# Patient Record
Sex: Male | Born: 1985 | Race: White | Hispanic: No | Marital: Single | Smoking: Former smoker
Health system: Southern US, Community
[De-identification: ages and names within clinical notes are randomized; demographics above are authoritative.]

## PROBLEM LIST (undated history)

## (undated) DIAGNOSIS — F909 Attention-deficit hyperactivity disorder, unspecified type: Secondary | ICD-10-CM

## (undated) DIAGNOSIS — G118 Other hereditary ataxias: Secondary | ICD-10-CM

## (undated) DIAGNOSIS — R269 Unspecified abnormalities of gait and mobility: Secondary | ICD-10-CM

## (undated) DIAGNOSIS — I10 Essential (primary) hypertension: Secondary | ICD-10-CM

## (undated) HISTORY — DX: Attention-deficit hyperactivity disorder, unspecified type: F90.9

## (undated) HISTORY — PX: WISDOM TOOTH EXTRACTION: SHX21

## (undated) HISTORY — DX: Other hereditary ataxias: G11.8

## (undated) HISTORY — PX: THYROID SURGERY: SHX805

---
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
## 2006-05-18 ENCOUNTER — Emergency Department (HOSPITAL_COMMUNITY): Admission: EM | Admit: 2006-05-18 | Discharge: 2006-05-18 | Payer: Self-pay | Admitting: Emergency Medicine

## 2007-10-08 ENCOUNTER — Ambulatory Visit: Payer: Self-pay | Admitting: Pediatrics

## 2007-10-20 ENCOUNTER — Ambulatory Visit: Payer: Self-pay | Admitting: Pediatrics

## 2008-12-30 ENCOUNTER — Encounter: Admission: RE | Admit: 2008-12-30 | Discharge: 2008-12-30 | Payer: Self-pay | Admitting: Family Medicine

## 2010-04-02 IMAGING — CT CT HEAD W/O CM
2 series · 16 of 30 positions shown, 20 images · non-contrast
Comparison: None

CLINICAL DATA: Headache.

CT HEAD WITHOUT CONTRAST
TECHNIQUE: Contiguous axial images were obtained from the base of
the skull through the vertex without contrast

[Series 2: head wo · axial · 0.49mm/px · z∈[-371,-225]mm · 13 of 34 slices shown, 17 images]
[im 3/34  brain]
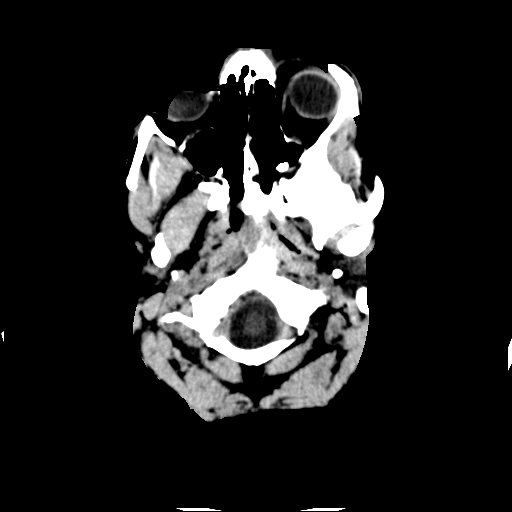
[im 3/34  bone]
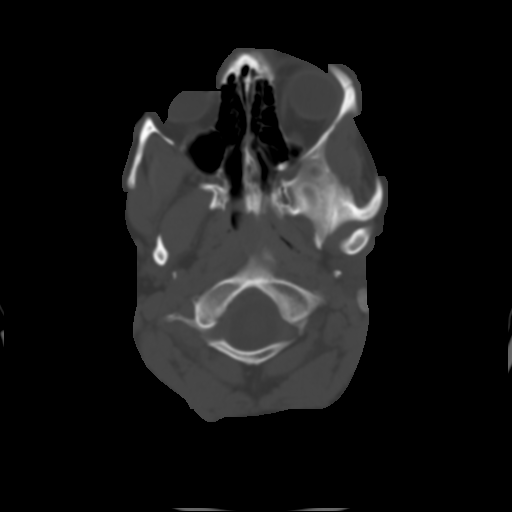
[im 5/34  brain]
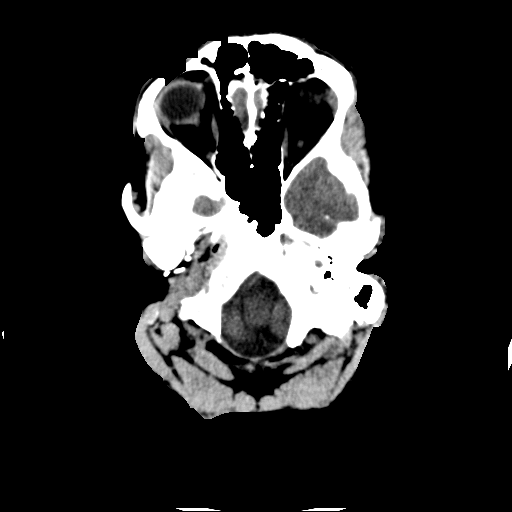
[im 8/34  brain]
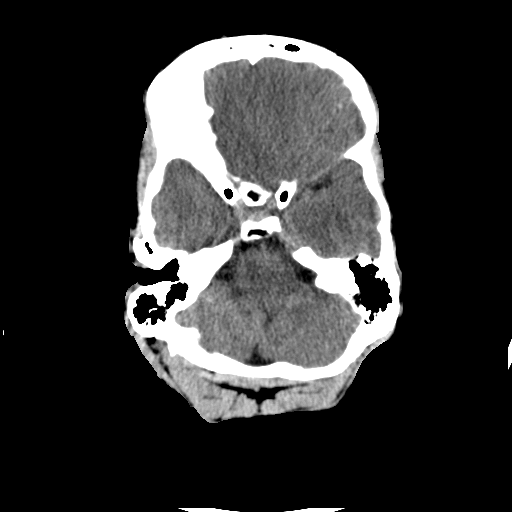
[im 10/34  brain]
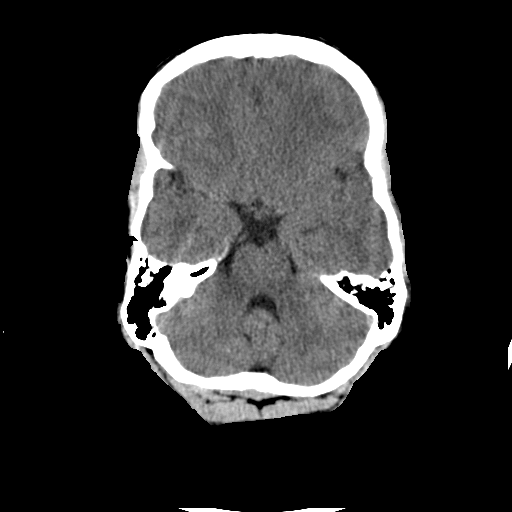
[im 12/34  brain]
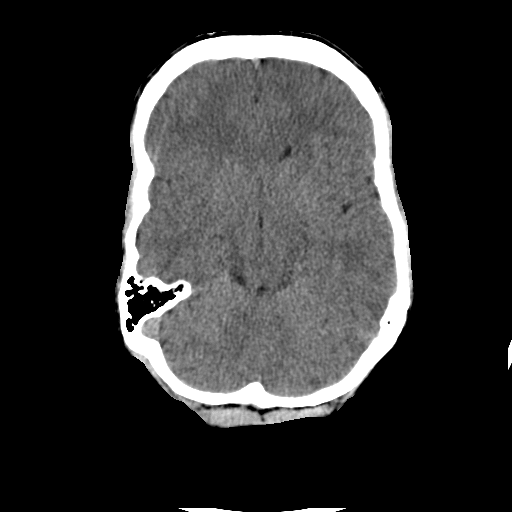
[im 12/34  bone]
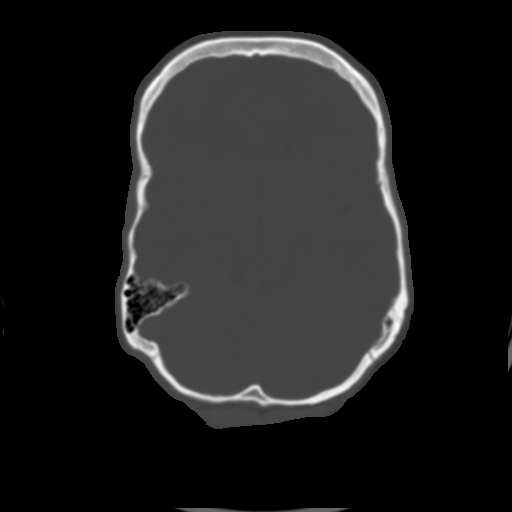
[im 15/34  brain]
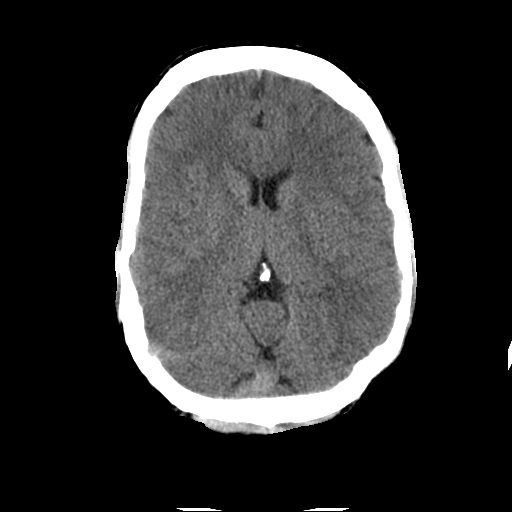
[im 17/34  brain]
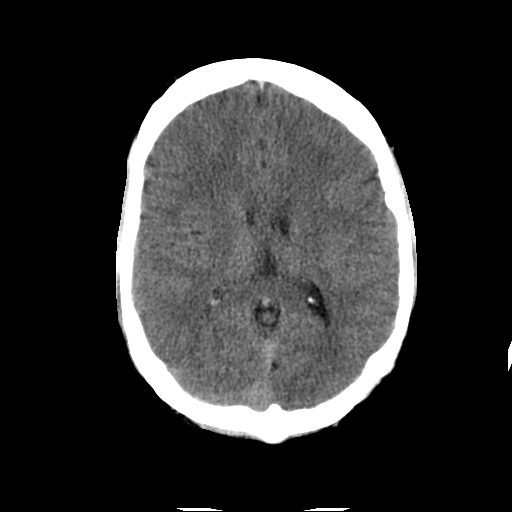
[im 19/34  brain]
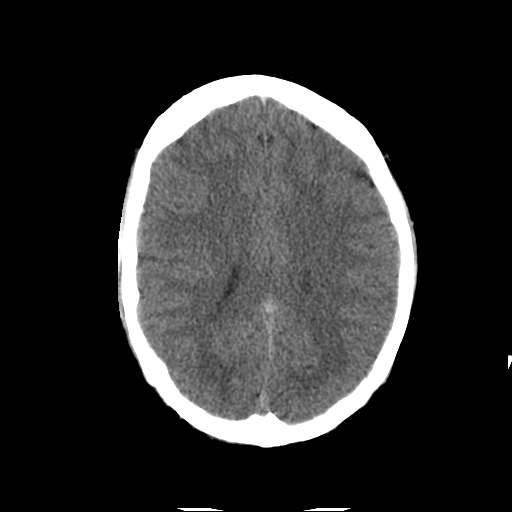
[im 22/34  brain]
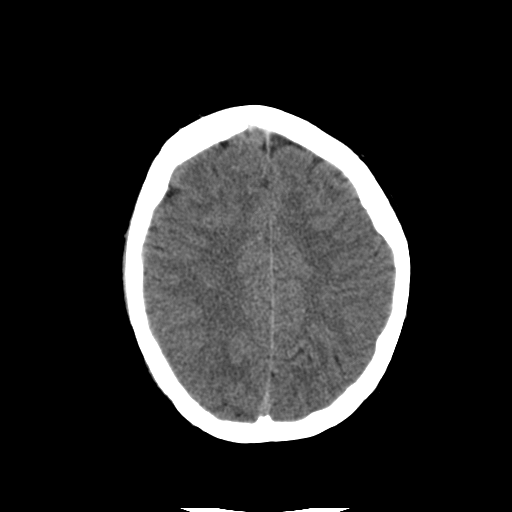
[im 22/34  bone]
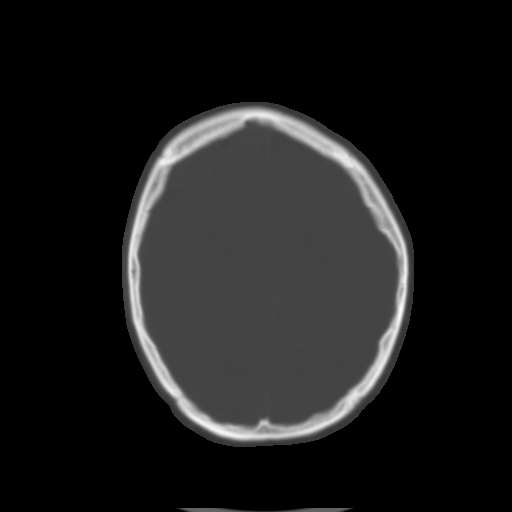
[im 24/34  brain]
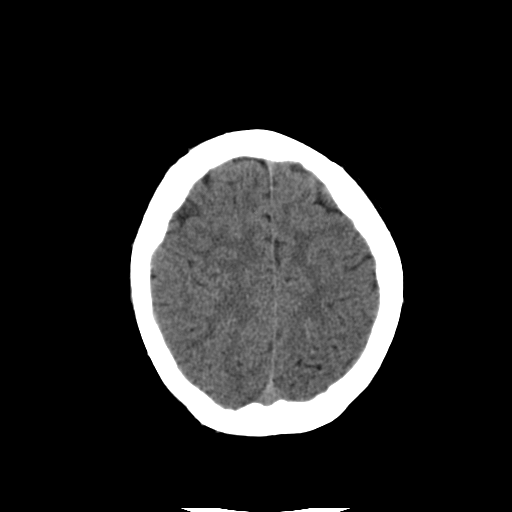
[im 26/34  brain]
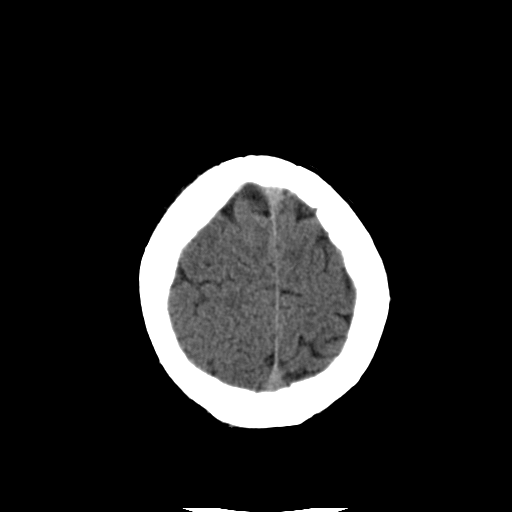
[im 29/34  brain]
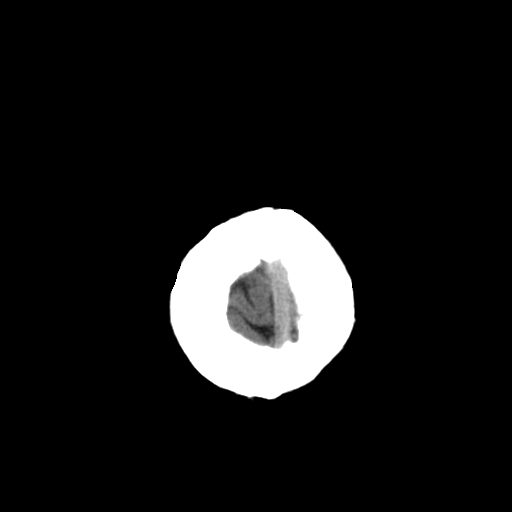
[im 31/34  brain]
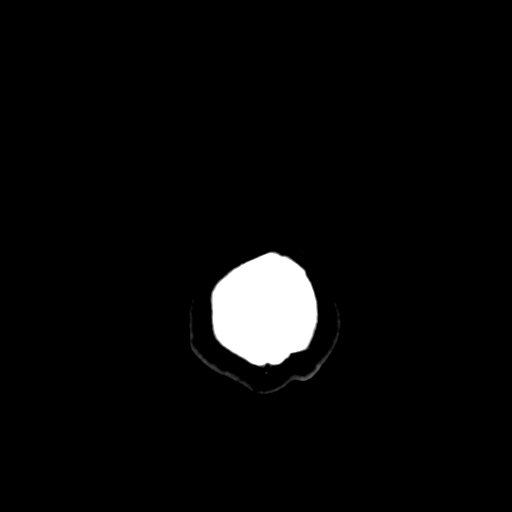
[im 31/34  bone]
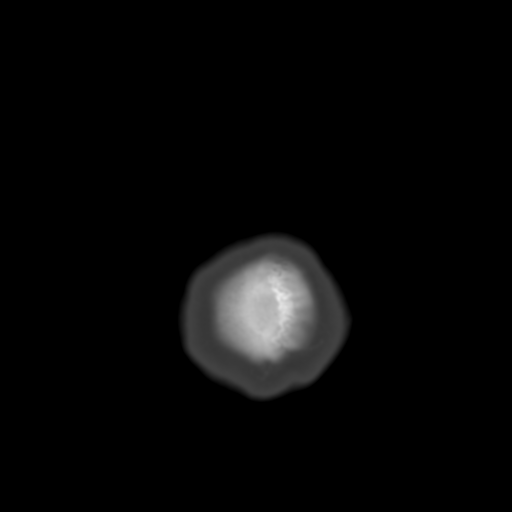

[Series 3: head bone · axial · 0.49mm/px · z∈[-371,-324]mm · 3 of 34 slices shown]
[im 3/34  bone]
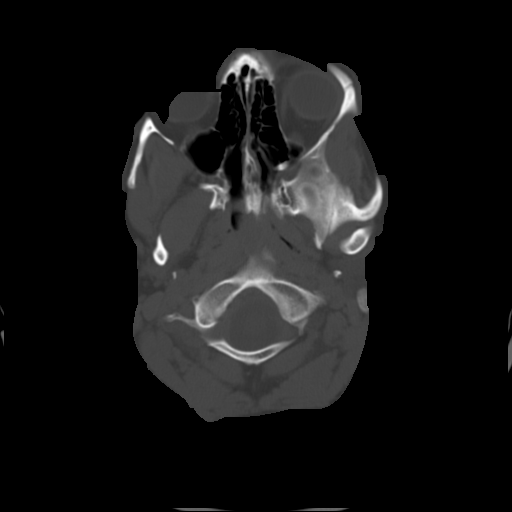
[im 8/34  bone]
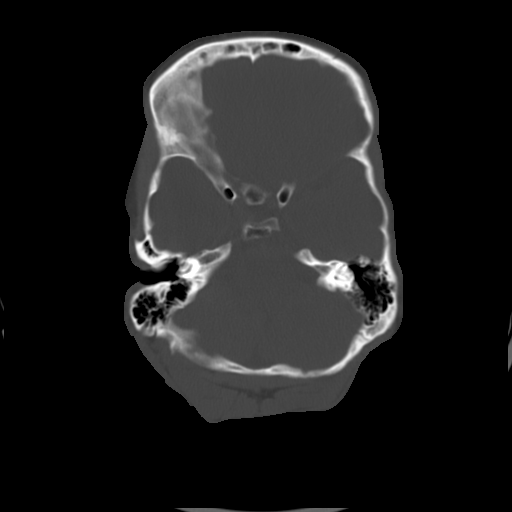
[im 12/34  bone]
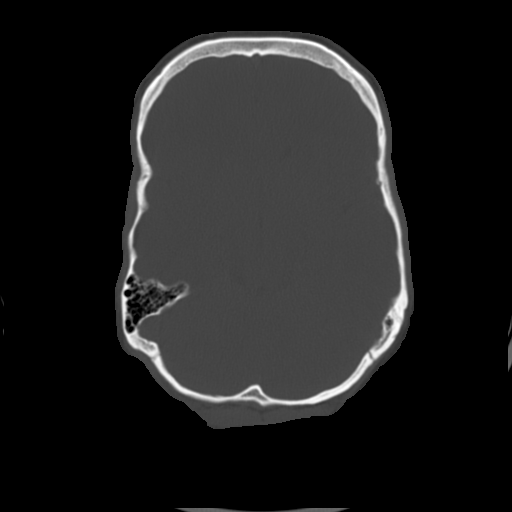

[16 of 30 positions shown; findings below may reference images not displayed]

FINDINGS: The brain has a normal appearance without evidence for
hemorrhage, acute infarction, hydrocephalus, or mass lesion.  There
is no extra axial fluid collection.  The skull and paranasal
sinuses are normal.
IMPRESSION: Normal CT of the head without contrast.

## 2011-06-04 ENCOUNTER — Other Ambulatory Visit: Payer: Self-pay | Admitting: Orthopedic Surgery

## 2011-06-04 DIAGNOSIS — M25562 Pain in left knee: Secondary | ICD-10-CM

## 2011-06-06 ENCOUNTER — Ambulatory Visit
Admission: RE | Admit: 2011-06-06 | Discharge: 2011-06-06 | Disposition: A | Discharge status: Home / Self Care | Payer: BC Managed Care – PPO | Source: Ambulatory Visit | Attending: Orthopedic Surgery | Admitting: Orthopedic Surgery

## 2011-06-06 DIAGNOSIS — M25562 Pain in left knee: Secondary | ICD-10-CM

## 2014-04-04 ENCOUNTER — Emergency Department (HOSPITAL_COMMUNITY)
Admission: EM | Admit: 2014-04-04 | Discharge: 2014-04-04 | Disposition: A | Discharge status: Home / Self Care | Payer: BLUE CROSS/BLUE SHIELD | Attending: Emergency Medicine | Admitting: Emergency Medicine

## 2014-04-04 ENCOUNTER — Encounter (HOSPITAL_COMMUNITY): Payer: Self-pay | Admitting: Emergency Medicine

## 2014-04-04 ENCOUNTER — Emergency Department (HOSPITAL_COMMUNITY): Payer: BLUE CROSS/BLUE SHIELD

## 2014-04-04 DIAGNOSIS — Z720 Tobacco use: Secondary | ICD-10-CM | POA: Diagnosis not present

## 2014-04-04 DIAGNOSIS — I10 Essential (primary) hypertension: Secondary | ICD-10-CM | POA: Diagnosis not present

## 2014-04-04 DIAGNOSIS — Z880 Allergy status to penicillin: Secondary | ICD-10-CM | POA: Insufficient documentation

## 2014-04-04 DIAGNOSIS — R079 Chest pain, unspecified: Secondary | ICD-10-CM | POA: Diagnosis present

## 2014-04-04 DIAGNOSIS — R0789 Other chest pain: Secondary | ICD-10-CM | POA: Diagnosis not present

## 2014-04-04 HISTORY — DX: Essential (primary) hypertension: I10

## 2014-04-04 MED ORDER — OMEPRAZOLE 20 MG PO CPDR: 20.0000 mg | DELAYED_RELEASE_CAPSULE | Freq: Every day | ORAL | Status: DC

## 2014-04-04 NOTE — ED Notes (Signed)
Pt reports sharp chest pain at 0400, fell back asleep, and awoke to pain again. Pt denies pain at present time. Pt reports had three beers prior to bed.

## 2014-04-04 NOTE — ED Provider Notes (Signed)
CSN: [id number]     Arrival date & time 04/04/14  0636 History   First MD Initiated Contact with Patient 04/04/14 [phone]     Chief Complaint  Patient presents with  . Chest Pain      The history is provided by the patient.   patient presents to the emergency department describing a sharp central chest pain without radiation.  It woke him up at around 4:30 AM.  Currently he is without any discomfort or pain.  He smokes cigarettes and reports that one point he was told he had high blood pressure.  No family history of early cardiac disease.  Denies diabetes or high cholesterol.  He states that he did drink alcohol yesterday which is atypical for him.  He denies upper abdominal pain.  Denies nausea or vomiting.  No shortness of breath.  No recent upper respiratory tract infections.  Denies cough or congestion.  He states he exerts himself regularly at work and never develops chest pain or pressure or shortness of breath while at work.  Symptoms were mild to moderate in severity.  Currently is without any discomfort or pain.  Past Medical History  Diagnosis Date  . Hypertension    No past surgical history on file. No family history on file. History  Substance Use Topics  . Smoking status: Current Every Day Smoker -- 0.50 packs/day    Types: Cigarettes  . Smokeless tobacco: Never Used  . Alcohol Use: Yes    Review of Systems  All other systems reviewed and are negative.     Allergies  Amoxicillin  Home Medications   Prior to Admission medications   Not on File   BP 162/92 mmHg  Pulse 92  Resp 16  Ht  (1.88 m)  Wt 185 lb 12.8 oz (84.278 kg)  BMI 23.85 kg/m2  SpO2 100% Physical Exam  Constitutional: He is oriented to person, place, and time. He appears well-developed and well-nourished.  HENT:  Head: Normocephalic and atraumatic.  Eyes: EOM are normal.  Neck: Normal range of motion.  Cardiovascular: Normal rate, regular rhythm, normal heart sounds and intact distal  pulses.   Pulmonary/Chest: Effort normal and breath sounds normal. No respiratory distress.  Abdominal: Soft. He exhibits no distension. There is no tenderness.  Musculoskeletal: Normal range of motion.  Neurological: He is alert and oriented to person, place, and time.  Skin: Skin is warm and dry.  Psychiatric: He has a normal mood and affect. Judgment normal.  Nursing note and vitals reviewed.   ED Course  Procedures (including critical care time) Labs Review Labs Reviewed - No data to display  Imaging Review Dg Chest 2 View  04/04/2014   CLINICAL DATA:  Mid chest pain beginning this morning.  EXAM: CHEST  2 VIEW  COMPARISON:  None.  FINDINGS: The lungs are clear. Heart size is normal. There is no pneumothorax or pleural effusion. Thoracolumbar scoliosis is noted.  IMPRESSION: No acute disease.   Electronically Signed   By: Drusilla Kanner M.D.   On: 04/04/2014 08:05   I personally reviewed the imaging tests through PACS system I reviewed available ER/hospitalization records through the EMR if available    EKG Interpretation   Date/Time:  Monday April 04 2014 06:56:55 EST Ventricular Rate:  82 PR Interval:  158 QRS Duration: 87 QT Interval:  362 QTC Calculation: 423 R Axis:   62 Text Interpretation:  Sinus rhythm Left atrial enlargement ST elev,  probable normal early repol pattern Baseline  wander in lead(s) V2 No old  tracing to compare Confirmed by Hydee Fleece  MD, Caryn Bee ([id number]) on 04/04/2014  7:03:36 AM      MDM   Final diagnoses:  Chest pain    Atypical chest pain.  Doubt ACS.  Doubt PE.  PERC negative.  Suspect GI related issues/esophageal spasm.  Home on Prilosec.  Asymptomatic at this time.  EKG and chest x-ray are normal    Lyanne Co, MD 04/04/14 [phone]

## 2014-04-04 NOTE — Discharge Instructions (Signed)

## 2014-04-04 NOTE — ED Notes (Signed)
Pt arrived to the ED with a complaint of chest pain.  Pt states pain woke him up at 0430 this am with a sharp sensation.  Pt presently doesn't have the pain.  Pt states he feels his left arm going numb.

## 2015-07-06 IMAGING — CR DG CHEST 2V
2 series · 2 of 2 positions shown · non-contrast
Comparison: None.

CLINICAL DATA: Mid chest pain beginning this morning.

EXAM:
CHEST  2 VIEW

[w chest pa]
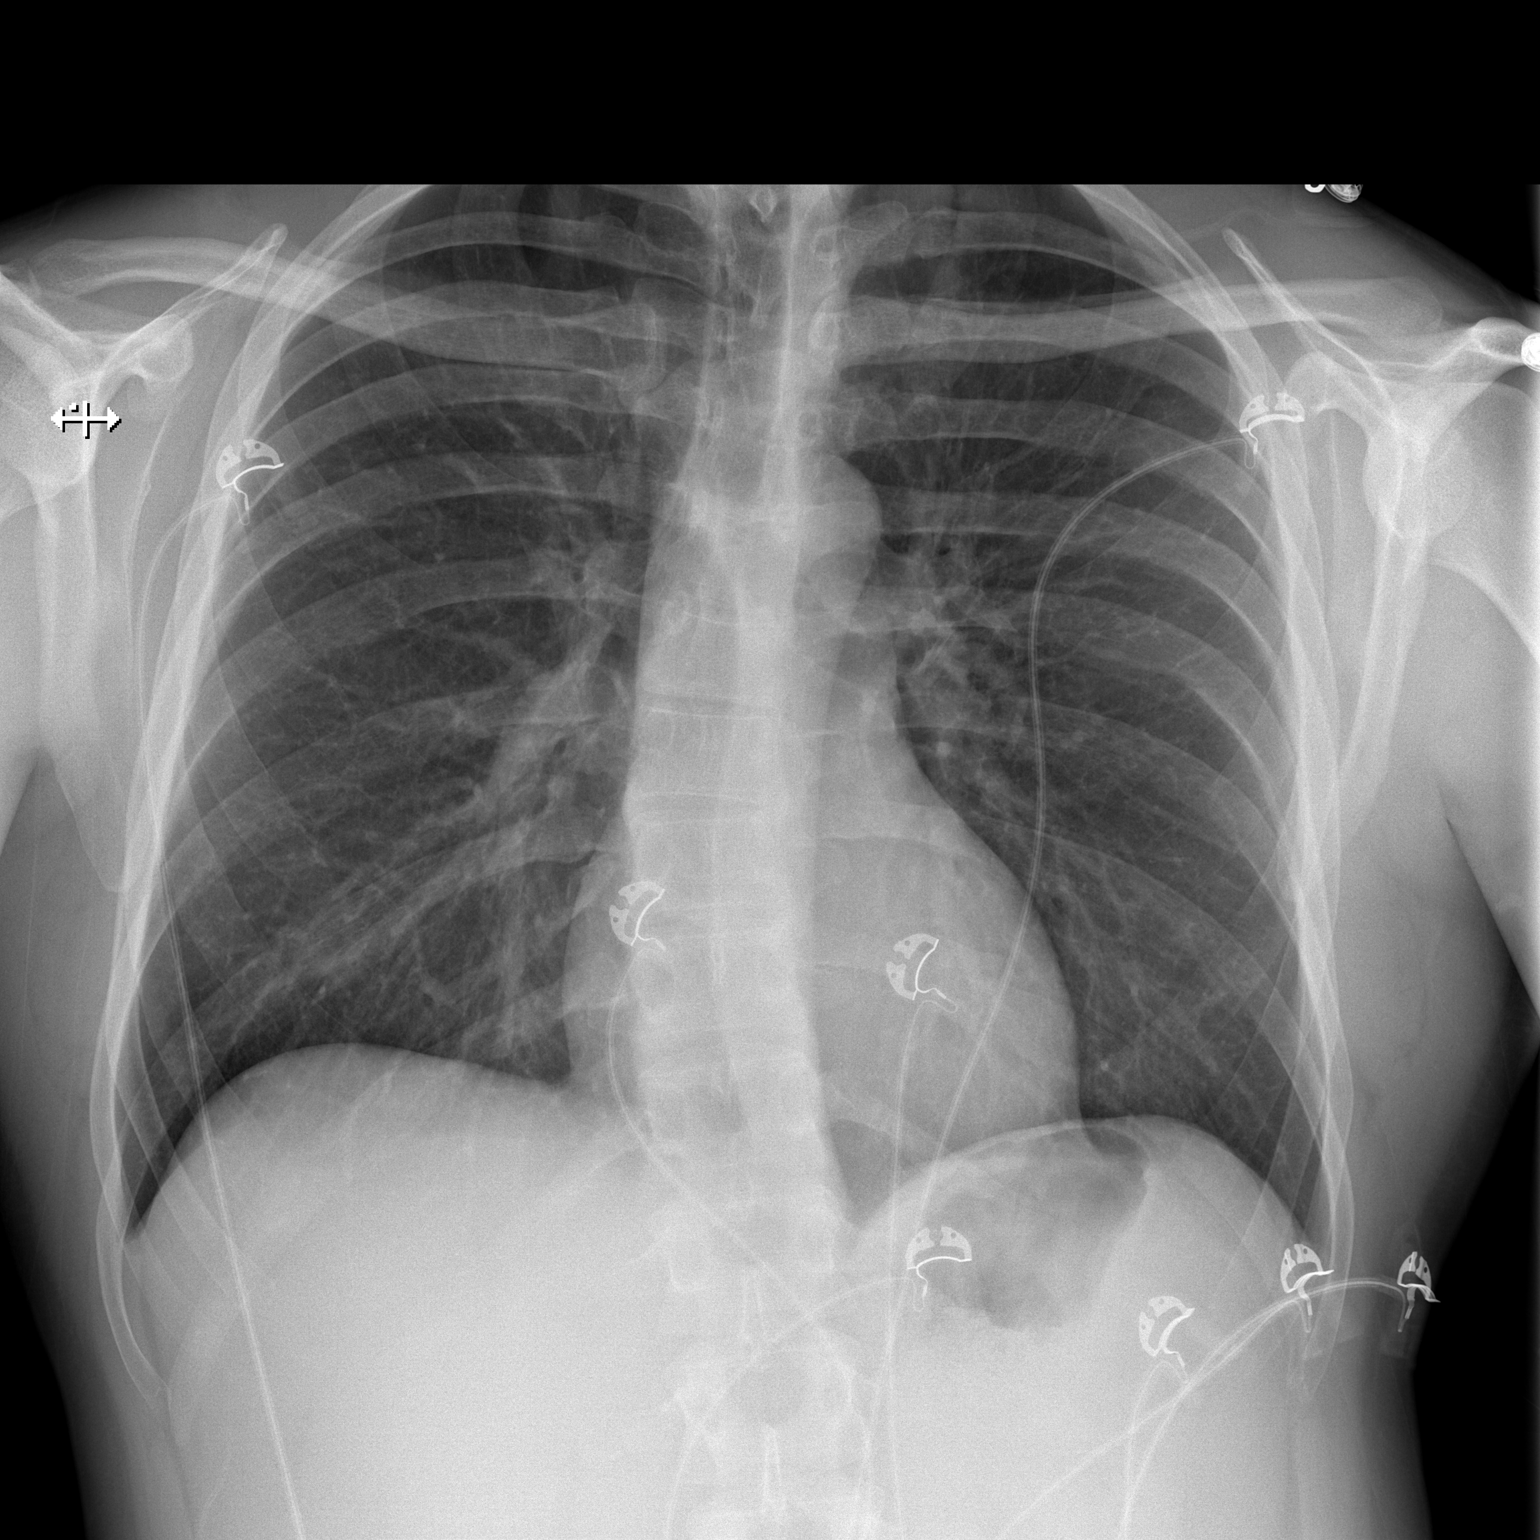

[w chest lat]
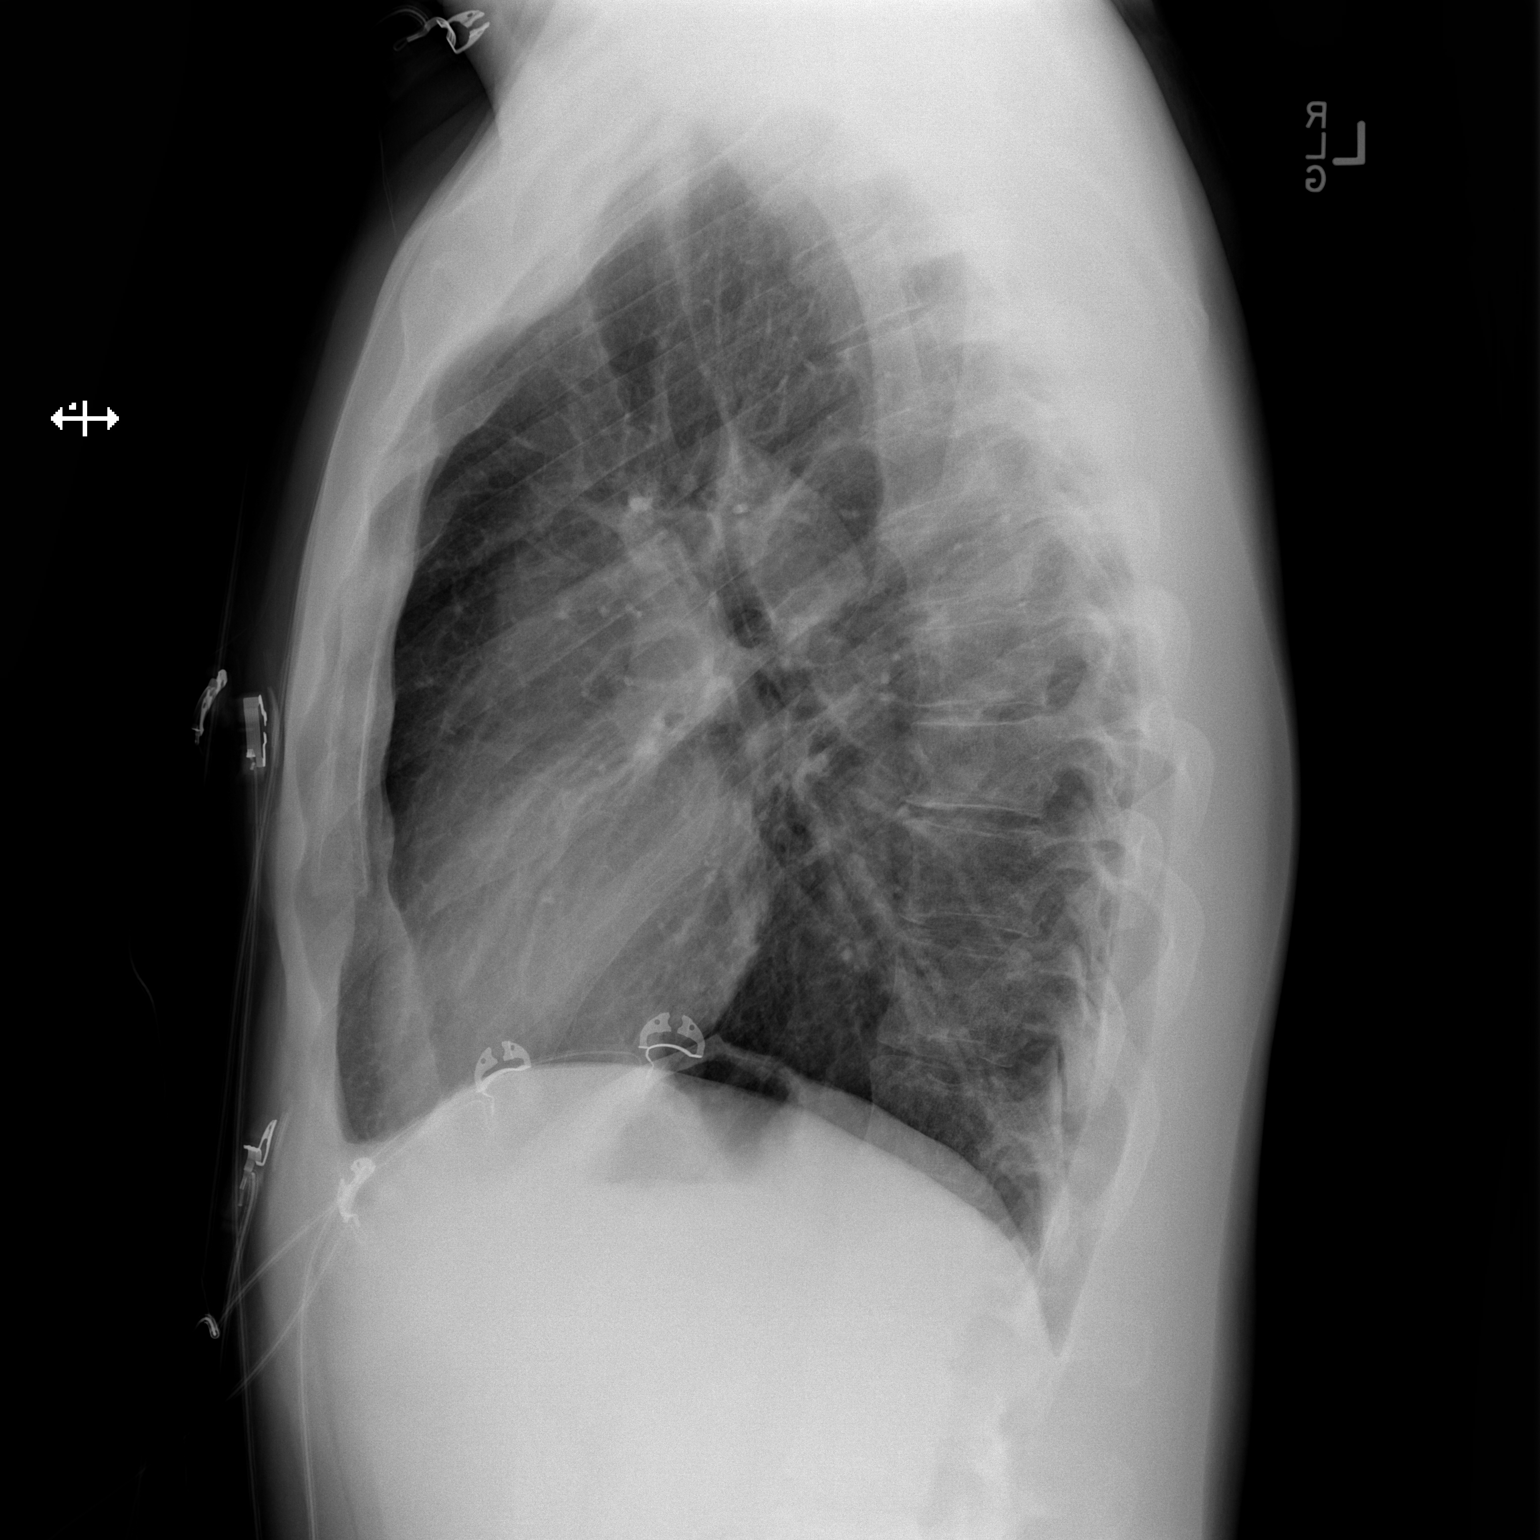

[2 of 2 positions shown; findings below may reference images not displayed]

FINDINGS: The lungs are clear. Heart size is normal. There is no pneumothorax
or pleural effusion. Thoracolumbar scoliosis is noted.
IMPRESSION: No acute disease.

## 2017-07-30 ENCOUNTER — Encounter: Payer: Self-pay | Admitting: *Deleted

## 2017-07-31 ENCOUNTER — Ambulatory Visit: Payer: Self-pay | Admitting: Neurology

## 2017-07-31 ENCOUNTER — Encounter: Payer: Self-pay | Admitting: Neurology

## 2017-07-31 ENCOUNTER — Other Ambulatory Visit: Payer: Self-pay

## 2017-07-31 DIAGNOSIS — G118 Other hereditary ataxias: Secondary | ICD-10-CM | POA: Insufficient documentation

## 2017-07-31 HISTORY — DX: Other hereditary ataxias: G11.8

## 2017-07-31 NOTE — Progress Notes (Signed)
Reason for visit: Spinocerebellar ataxia  Referring physician: Dr. Georgiann Hahn is a 32 y.o. male  History of present illness:  Jonathan Hopkins is a 32 year old right-handed white male with a history of difficulty with gait instability that began when he was 25 or 32 years old, this has been gradually worsening over time.  The patient has also noted over the last 1 or 2 years that he has had some horizontal double vision that will come and go.  The patient indicates that if he misses a dose of Adderall that the double vision becomes much worse.  He reports no problems with speech or swallowing, but clinical examination does indicate that he has some mild dysarthria.  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  The patient denies numbness or weakness of the extremities.  He denies issues controlling the bowels or the bladder.  He has a very prominent family history of spinocerebellar ataxia type III.  The patient's mother, maternal aunt, and 3 sisters are affected.  At least 2 of the sisters have had genetic testing, both positive for spinocerebellar ataxia type III.  2 of the 3 sisters are reporting double vision as well.  The patient's mother just passed away with end-stage spinocerebellar ataxia at age 2.  The patient comes to this office for an evaluation.  Past Medical History:  Diagnosis Date  . ADHD   . Hypertension   . 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    Past Surgical History:  Procedure Laterality Date  . WISDOM TOOTH EXTRACTION     x4    Family History  Problem Relation Age of Onset  . Other Mother        Cerebellar ataxia  . Breast cancer Mother   . Ataxia Mother   . Alcoholism Father   . Ataxia Sister   . Ataxia Sister   . Ataxia Sister     Social history:  reports that he has been smoking cigarettes.  He has been smoking  about 0.50 packs per day. He has never used smokeless tobacco. He reports that he drank alcohol. He reports that he does not use drugs.  Medications:  Prior to Admission medications   Medication Sig Start Date End Date Taking? Authorizing Provider  amphetamine-dextroamphetamine (ADDERALL) 15 MG tablet Take 1 tablet by mouth 2 (two) times daily. 03/16/14  Yes [provider]  Ascorbic Acid (VITAMIN C PO) Take 400 mg by mouth daily.   Yes [provider]  Coenzyme Q10 (COQ10 PO) Take 1 capsule by mouth daily.   Yes [provider]  Multiple Vitamins-Minerals (MULTI COMPLETE PO) Take 1 capsule by mouth daily.   Yes [provider]  Omega-3 Fatty Acids (OMEGA 3 PO) Take 1 capsule by mouth daily.   Yes [provider]      Allergies  Allergen Reactions  . Amoxicillin Swelling    ROS:  Out of a complete 14 system review of symptoms, the patient complains only of the following symptoms, and all other reviewed systems are negative.  Double vision  Blood pressure (!) 148/88, pulse 82, height  (1.88 m), weight 197 lb 8 oz (89.6 kg).  Physical Exam  General: The patient is alert and cooperative at the time of the examination.  Eyes: Pupils are equal, round, and reactive to light. Discs are flat bilaterally.  Neck: The neck is supple, no carotid bruits are noted.  Respiratory: The respiratory examination is clear.  Cardiovascular: The cardiovascular examination reveals a regular rate and rhythm, no obvious murmurs or rubs are noted.  Skin: Extremities are without significant edema.  Neurologic Exam  Mental status: The patient is alert and oriented x 3 at the time of the examination. The patient has apparent normal recent and remote memory, with an apparently normal attention span and concentration ability.  Cranial nerves: Facial symmetry is present. There is good sensation of the face to pinprick and soft touch bilaterally. The  strength of the facial muscles and the muscles to head turning and shoulder shrug are normal bilaterally. Speech is slightly dysarthric, not a phasic. Extraocular movements are full.  There may be some mild end-gaze nystagmus.  Visual fields are full. The tongue is midline, and the patient has symmetric elevation of the soft palate. No obvious hearing deficits are noted.  Motor: The motor testing reveals 5 over 5 strength of all 4 extremities. Good symmetric motor tone is noted throughout.  Sensory: Sensory testing is intact to pinprick, soft touch, vibration sensation, and position sense on all 4 extremities. No evidence of extinction is noted.  Coordination: Cerebellar testing reveals very minimal dysmetria with finger-nose-finger and heel shin bilaterally.  Gait and station: Gait is wide-based, slightly ataxic.  Tandem gait is unsteady.  Romberg is negative.  Reflexes: Deep tendon reflexes are symmetric, but are brisk bilaterally. Toes are downgoing bilaterally.   Assessment/Plan:  1.  Spinocerebellar ataxia type 3, Machado-Joseph disease  The patient is having gait instability, mild dysarthria, and double vision associated with his spinocerebellar ataxia syndrome.  Individuals with this disease may also developed dystonias and muscle fasciculations, and signs of parkinsonism.  A peripheral neuropathy and autonomic nervous system dysfunction may also occur.  The patient has at least 5 known family members who are also affected, two have been genetically tested.  There is no indication to pursue genetic testing with this individual, there is no treatment for the disorder.  The patient does not wish to see an ophthalmologist for consideration of prisms for double vision.  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  He will follow-up in 1 year.  He has no medical  insurance.  Jonathan Palau MD 07/31/2017 10:48 AM  [facility] [address] [place], [place] [address]  Phone [phone] Fax [phone]

## 2018-07-22 DIAGNOSIS — Z0289 Encounter for other administrative examinations: Secondary | ICD-10-CM

## 2018-08-04 ENCOUNTER — Telehealth: Payer: Self-pay

## 2018-08-04 NOTE — Telephone Encounter (Signed)
I contacted the pt in regards to his 08/05/2018 yearly f/u appt.  Pt was advised due to current COVID 19 pandemic, our office is severely reducing in office visits until further notice, in order to minimize the risk to our patients and healthcare providers.   Pt was offered a video visit with Dr. Anne Hahn on the same date but declined.  Pt has been rescheduled for 10/19/18 at 12 pm.

## 2018-08-05 ENCOUNTER — Ambulatory Visit: Payer: Self-pay | Admitting: Neurology

## 2018-09-08 DIAGNOSIS — Z0271 Encounter for disability determination: Secondary | ICD-10-CM

## 2018-10-08 DIAGNOSIS — Z0289 Encounter for other administrative examinations: Secondary | ICD-10-CM

## 2018-10-19 ENCOUNTER — Other Ambulatory Visit: Payer: Self-pay

## 2018-10-19 ENCOUNTER — Ambulatory Visit (INDEPENDENT_AMBULATORY_CARE_PROVIDER_SITE_OTHER): Payer: Self-pay | Admitting: Neurology

## 2018-10-19 ENCOUNTER — Encounter: Payer: Self-pay | Admitting: Neurology

## 2018-10-19 VITALS — BP 144/92 | HR 90 | Temp 98.3°F | Ht 73.5 in | Wt 214.3 lb

## 2018-10-19 DIAGNOSIS — G118 Other hereditary ataxias: Secondary | ICD-10-CM

## 2018-10-19 NOTE — Progress Notes (Signed)
Reason for visit: Spinocerebellar ataxia type III  Jonathan Hopkins is an 33 y.o. male  History of present illness:  Jonathan Hopkins is a 33 year old right-handed white male with a history of spinocerebellar ataxia type III, his mother and 3 of his sisters are affected, the patient indicates that he is no longer working.  He last worked on 14 March 2018.  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  The patient has some problems with gait instability, he has moved to a 1 level living arrangement which has been better for him.  He will fall on occasion, he does not use a cane for ambulation.  He has persistent double vision.  The patient reports some occasional tingling in the hands and feet.  He denies any discomfort in the feet at night.  He recently has developed some pain in the knees bilaterally with weightbearing, with pain underneath the kneecaps on both sides.  He has not seen an orthopedic physician for this.  The patient returns for an evaluation.  The patient lives alone.  He is still operates a Teacher, music.  Past Medical History:  Diagnosis Date  . ADHD   . Hypertension   . SCA-3 (spinocerebellar ataxia type 3) ([facility]) 07/31/2017    Past Surgical History:  Procedure Laterality Date  . WISDOM TOOTH EXTRACTION     x4    Family History  Problem Relation Age of Onset  . Other Mother        Cerebellar ataxia  . Breast cancer Mother   . Ataxia Mother   . Alcoholism Father   . Ataxia Sister   . Ataxia Sister   . Ataxia Sister     Social history:  reports that he has been smoking cigarettes. He has been smoking about 0.50 packs per day. He has never used smokeless tobacco. He reports previous alcohol use. He reports that he does not use drugs.    Allergies  Allergen Reactions  . Amoxicillin Swelling    Medications:  Prior to Admission medications   Medication Sig Start Date End Date Taking? Authorizing Provider  Coenzyme Q10 (COQ10 PO) Take 1  capsule by mouth daily.   Yes [provider]  Multiple Vitamins-Minerals (MULTI COMPLETE PO) Take 1 capsule by mouth daily.   Yes [provider]  Omega-3 Fatty Acids (OMEGA 3 PO) Take 1 capsule by mouth daily.   Yes [provider]    ROS:  Out of a complete 14 system review of symptoms, the patient complains only of the following symptoms, and all other reviewed systems are negative.  Gait instability Knee pain  Blood pressure (!) 144/92, pulse 90, temperature 98.3 F (36.8 C), temperature source Temporal, height 6' 1.5" (1.867 m), weight 214 lb 5 oz (97.2 kg), SpO2 98 %.  Physical Exam  General: The patient is alert and cooperative at the time of the examination.  Skin: No significant peripheral edema is noted.   Neurologic Exam  Mental status: The patient is alert and oriented x 3 at the time of the examination. The patient has apparent normal recent and remote memory, with an apparently normal attention span and concentration ability.   Cranial nerves: Facial symmetry is present. Speech is normal, no aphasia or dysarthria is noted, speech may be slightly dysarthric. Extraocular movements are full. Visual fields are full.  Motor: The patient has good strength in all 4 extremities.  Sensory examination: Soft touch sensation is symmetric on  the face, arms, and legs.  Coordination: The patient has minimal dysmetria with finger-nose-finger, he does have more prominent dysmetria with heel-to-shin bilaterally.  Gait and station: The patient has a wide-based, unsteady gait.  Tandem gait is unsteady.  Romberg is negative.  Reflexes: Deep tendon reflexes are symmetric, but reflexes are brisk throughout.   Assessment/Plan:  1.  Spinocerebellar ataxia, type III  2.  Bilateral knee discomfort  The patient is appropriately applying for Social Security disability.  I have written a letter indicating that the patient has a disorder that is inherited and  it will be progressive.  The patient is still able to operate a motor vehicle.  He will follow-up in 1 year.  If the knee pain continues and the patient desires to have an orthopedic surgery referral, he will contact our office.  Jonathan Palau. Keith Willis MD 10/19/2018 12:18 PM  [facility] [address] [place], [place] [address]  Phone [phone] Fax [phone]

## 2018-12-11 ENCOUNTER — Telehealth: Payer: Self-pay | Admitting: Neurology

## 2018-12-11 NOTE — Telephone Encounter (Signed)
Pt is asking for a call from RN on Monday re: advise on how to apply for disabilty for his neurological disease and how Dr Jannifer Franklin can assist him

## 2018-12-14 NOTE — Telephone Encounter (Signed)
I reached out to the pt. He has been in contact with the SSD and has applied for disability. Pt is at the point now where his appeal is coming up and is requesting an appointment with the MD in about 1.5 months to have an up to date assessment. Pt has been scheduled for 01/19/2019 at 12 pm check in time of 11:30 for this appointment.

## 2019-01-19 ENCOUNTER — Other Ambulatory Visit: Payer: Self-pay

## 2019-01-19 ENCOUNTER — Encounter: Payer: Self-pay | Admitting: Neurology

## 2019-01-19 ENCOUNTER — Ambulatory Visit (INDEPENDENT_AMBULATORY_CARE_PROVIDER_SITE_OTHER): Payer: Self-pay | Admitting: Neurology

## 2019-01-19 VITALS — BP 158/112 | HR 82 | Temp 98.7°F | Ht 73.5 in | Wt 217.0 lb

## 2019-01-19 DIAGNOSIS — R269 Unspecified abnormalities of gait and mobility: Secondary | ICD-10-CM

## 2019-01-19 DIAGNOSIS — G118 Other hereditary ataxias: Secondary | ICD-10-CM

## 2019-01-19 HISTORY — DX: Unspecified abnormalities of gait and mobility: R26.9

## 2019-01-19 NOTE — Progress Notes (Signed)
Reason for visit: Spinocerebellar ataxia  Jonathan Hopkins is an 33 y.o. male  History of present illness:  Jonathan Hopkins is a 33 year old right-handed white male with a history of type III spinocerebellar ataxia.  The patient has had progressive worsening of his ability to ambulate.  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  The patient is having increasing problems with balance, he is falling on occasion, the last fall was 2 weeks ago.  The patient does not use a cane or a walking stick for ambulation.  He has problems with restless leg syndrome and leg cramps, he feels tension throughout the body.  He has double vision that restricts his ability to operate a motor vehicle in the evening hours, he is able to do some driving during the daytime.  The patient has multiple family members who are involved, 3 sisters are similarly affected.  He is applying for Social Security disability currently, he has been denied on one occasion.  Past Medical History:  Diagnosis Date  . ADHD   . Hypertension   . 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    Past Surgical History:  Procedure Laterality Date  . WISDOM TOOTH EXTRACTION     x4    Family History  Problem Relation Age of Onset  . Other Mother        Cerebellar ataxia  . Breast cancer Mother   . Ataxia Mother   . Alcoholism Father   . Ataxia Sister   . Ataxia Sister   . Ataxia Sister     Social history:  reports that he has been smoking cigarettes. He has been smoking about 0.50 packs per day. He has never used smokeless tobacco. He reports previous alcohol use. He reports that he does not use drugs.    Allergies  Allergen Reactions  . Amoxicillin Swelling    Medications:  Prior to Admission medications   Medication Sig Start Date End Date Taking? Authorizing Provider  Multiple Vitamins-Minerals (MULTI  COMPLETE PO) Take 1 capsule by mouth daily.   Yes [provider]  Omega-3 Fatty Acids (OMEGA 3 PO) Take 1 capsule by mouth daily.   Yes [provider]    ROS:  Out of a complete 14 system review of symptoms, the patient complains only of the following symptoms, and all other reviewed systems are negative.  Walking difficulty Double vision  Blood pressure (!) 158/112, pulse 82, temperature 98.7 F (37.1 C), height 6' 1.5" (1.867 m), weight 217 lb (98.4 kg).  Physical Exam  General: The patient is alert and cooperative at the time of the examination.  Skin: No significant peripheral edema is noted.   Neurologic Exam  Mental status: The patient is alert and oriented x 3 at the time of the examination. The patient has apparent normal recent and remote memory, with an apparently normal attention span and concentration ability.   Cranial nerves: Facial symmetry is present. Speech is minimally ataxic, not dysarthric or aphasic. Extraocular movements are full, but with superior gaze there is divergence with exotropia of the right eye. Visual fields are full.  Motor: The patient has good strength in all 4 extremities.  Sensory examination: Soft touch sensation is symmetric on the face, arms, and legs.  Coordination: The patient has mild dysmetria with finger-nose-finger with the upper extremities, more prominent dysmetria with heel-to-shin bilaterally.  Gait and station: The patient has a very wide-based, ataxic gait.  He will stumble on occasion.  He is unable to perform tandem gait.  Romberg is positive, the patient tends to fall to the left side.  Reflexes: Deep tendon reflexes are symmetric, but are brisk throughout.   Assessment/Plan:  1.  Spinocerebellar ataxia, type III  2.  Gait disorder  The patient has a genetic entity that will result in gradually worsening physical deficits with coordination of the arms and legs and with double vision.  He has a  very prominent family history of this similar genetic entity.  He is applying for social for disability.  He has no longer able to perform work-related activities that he has been trained for in the past.  He eventually will lose the ability to ambulate effectively as time goes on.  He currently is disabled from his previous job activities.  He will follow-up for his next scheduled visit.  Jonathan Alexanders MD 01/19/2019 12:44 PM  [facility] [address] [place], [place] [address]  Phone [phone] Fax [phone]

## 2019-04-26 DIAGNOSIS — Z0271 Encounter for disability determination: Secondary | ICD-10-CM

## 2019-10-20 ENCOUNTER — Ambulatory Visit: Payer: Self-pay | Admitting: Neurology

## 2019-10-20 ENCOUNTER — Other Ambulatory Visit: Payer: Self-pay

## 2019-10-20 ENCOUNTER — Encounter: Payer: Self-pay | Admitting: Neurology

## 2019-10-20 VITALS — BP 149/99 | HR 94 | Ht 73.0 in | Wt 207.0 lb

## 2019-10-20 DIAGNOSIS — R269 Unspecified abnormalities of gait and mobility: Secondary | ICD-10-CM

## 2019-10-20 DIAGNOSIS — G118 Other hereditary ataxias: Secondary | ICD-10-CM

## 2019-10-20 NOTE — Progress Notes (Signed)
Reason for visit: Spinocerebellar ataxia  Jonathan Hopkins is an 34 y.o. male  History of present illness:  Jonathan Hopkins is a 34 year old right-handed white male with a history of spinocerebellar ataxia type III.  The patient has had ongoing issues with gait instability, he has had multiple falls.  He reports some urgency with the bowels and bladder.  He has occasional problems with choking on food, he has not had a major change in his ability to speak.  He is unemployed, he is seeking disability.  He now has an attorney for this purpose.  He does operate a motor vehicle but some days he is unable to drive due to worsening problems with double vision.  He is not sleeping well at night.  He has 3 sisters with similar afflictions.  The patient comes to this office for further evaluation.  Past Medical History:  Diagnosis Date  . ADHD   . Gait abnormality 01/19/2019  . Hypertension   . 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    Past Surgical History:  Procedure Laterality Date  . WISDOM TOOTH EXTRACTION     x4    Family History  Problem Relation Age of Onset  . Other Mother        Cerebellar ataxia  . Breast cancer Mother   . Ataxia Mother   . Alcoholism Father   . Ataxia Sister   . Ataxia Sister   . Ataxia Sister     Social history:  reports that he has been smoking cigarettes. He has been smoking about 0.50 packs per day. He has never used smokeless tobacco. He reports previous alcohol use. He reports that he does not use drugs.    Allergies  Allergen Reactions  . Amoxicillin Swelling    Medications:  Prior to Admission medications   Medication Sig Start Date End Date Taking? Authorizing Provider  Multiple Vitamins-Minerals (MULTI COMPLETE PO) Take 1 capsule by mouth daily.   Yes [provider]  Omega-3 Fatty Acids (OMEGA 3 PO) Take 1 capsule by mouth daily.   Yes [provider]    ROS:  Out of a complete 14 system review of  symptoms, the patient complains only of the following symptoms, and all other reviewed systems are negative.  Walking difficulty Occasional choking  Blood pressure (!) 149/99, pulse 94, height 6\' 1"  (1.854 m), weight 207 lb (93.9 kg).  Physical Exam  General: The patient is alert and cooperative at the time of the examination.  Skin: No significant peripheral edema is noted.   Neurologic Exam  Mental status: The patient is alert and oriented x 3 at the time of the examination. The patient has apparent normal recent and remote memory, with an apparently normal attention span and concentration ability.   Cranial nerves: Facial symmetry is present. Speech is normal, no aphasia or dysarthria is noted. Extraocular movements are full. Visual fields are full.  Motor: The patient has good strength in all 4 extremities.  Sensory examination: Soft touch sensation is symmetric on the face, arms, and legs.  Coordination: The patient has mild dysmetria with finger-nose-finger and heel-to-shin bilaterally.  Gait and station: The patient has a wide-based gait, the patient has some instability with turns.  Tandem gait was not attempted.  Romberg is negative but is unsteady.  Reflexes: Deep tendon reflexes are symmetric, reflexes are brisk throughout with exception that the ankle jerk reflexes are somewhat depressed.   Assessment/Plan:  1.  Spinocerebellar ataxia type  III  2.  Gait disturbance  The patient has a genetic progressive neurologic disorder.  He has significant issues with gait instability, he is falling on occasion.  I have indicated that he should be using a cane or a walking stick when he is outside of the house.  The patient is living with his sister currently.  He will follow-up through this office in 1 year.  Marlan Palau MD 10/20/2019 12:26 PM  [facility] [address] [place], [place] [address]  Phone [phone] Fax  [phone]

## 2020-01-13 ENCOUNTER — Telehealth: Payer: Self-pay | Admitting: Neurology

## 2020-01-13 NOTE — Telephone Encounter (Signed)
Left message for patient to call back regarding changes with his disease.

## 2020-01-13 NOTE — Telephone Encounter (Signed)
Pt called stating that he is needing to speak to the RN to update her and the provider about a couple of changes he has had with his disease that he has. Pt did not give any other information. The call was dropped. Please advise.

## 2020-01-17 NOTE — Telephone Encounter (Signed)
Pt called and LVM wanting the RN to call back when she is available.

## 2020-01-19 NOTE — Telephone Encounter (Signed)
Left message for patient to call back on voice mail.

## 2020-01-20 ENCOUNTER — Telehealth: Payer: Self-pay | Admitting: Emergency Medicine

## 2020-01-20 NOTE — Telephone Encounter (Signed)
Called patient back, given a list of complaints/increase of new symptoms.  Forwarded to Dr. Anne Hahn.

## 2020-01-20 NOTE — Telephone Encounter (Signed)
Called patient and scheduled him with Shawnie Dapper, NP for 02/02/20 at 3pm.  Informed to arrive no later than 245.  Patient accepted appointment and agreed to arrival time.  Patient expressed appreciation and stated he will keep his list and write anything else down that he feels needs discussed/addressed with NP at that time.

## 2020-01-20 NOTE — Telephone Encounter (Signed)
Pt has called Erika,RN back. Please call when available to do so.

## 2020-01-20 NOTE — Telephone Encounter (Signed)
   Called patient back, here is his list of new problems he is having with his disease  -Deteriorating hand coordination, ie, open chips, buttoning, writing -Only getting 1-1/2 hours sleep at a time -Numbness, twitching in both hands and feet -Knees randomly give out, having to wearing knee braces -numerous falls, no injuries -hands and legs are cramping up, waking him up -Increased uncontrolled bladder and bowels, 2x3x week urinating on self, bowel monthly -coughing spells daily -difficulty swallowing food and drinks, getting choked up  Sent list to Dr. Anne Hahn with request if okay to see NP or if he needs to see patient.

## 2020-02-02 ENCOUNTER — Encounter: Payer: Self-pay | Admitting: Family Medicine

## 2020-02-02 ENCOUNTER — Ambulatory Visit: Payer: Self-pay | Admitting: Family Medicine

## 2020-02-02 ENCOUNTER — Other Ambulatory Visit: Payer: Self-pay

## 2020-02-02 VITALS — BP 157/102 | HR 96 | Ht 73.0 in | Wt 201.0 lb

## 2020-02-02 DIAGNOSIS — R030 Elevated blood-pressure reading, without diagnosis of hypertension: Secondary | ICD-10-CM

## 2020-02-02 DIAGNOSIS — G118 Other hereditary ataxias: Secondary | ICD-10-CM

## 2020-02-02 DIAGNOSIS — R32 Unspecified urinary incontinence: Secondary | ICD-10-CM

## 2020-02-02 DIAGNOSIS — Z5989 Other problems related to housing and economic circumstances: Secondary | ICD-10-CM

## 2020-02-02 DIAGNOSIS — G4700 Insomnia, unspecified: Secondary | ICD-10-CM

## 2020-02-02 DIAGNOSIS — R1310 Dysphagia, unspecified: Secondary | ICD-10-CM

## 2020-02-02 NOTE — Progress Notes (Signed)
Chief Complaint  Patient presents with  . Follow-up    corner rm. Pt said he was told to come.     HISTORY OF PRESENT ILLNESS: Today 02/02/20  Jonathan Hopkins is a 34 y.o. male here today for follow up for spinocerebellar ataxia type III. He called to report list of progressive symptoms over the past year. Symptoms are not new but seem persistent and progressive.   -Deteriorating hand coordination, ie, open chips, buttoning, writing -Only getting 1-1/2 hours sleep at a time -Numbness, twitching in both hands and feet -Knees randomly give out, having to wearing knee braces -numerous falls, no injuries -hands and legs are cramping up, waking him up -Increased uncontrolled bladder and bowels, 2x3x week urinating on self, bowel monthly -coughing spells daily -difficulty swallowing food and drinks, getting choked up  He does not currently have health insurance. He is working with a disability attorney and has next hearing in 04/2020. He has not applied for patient assistance. He is living with his sister. He reports no falls or acute symptoms. He does not feel he can afford labs, imaging or therapy at this time. He wanted symptoms documented for his disability case.    HISTORY (copied from Dr Anne Hahn' note on 10/20/2019)  Jonathan Hopkins is a 34 year old right-handed white male with a history of spinocerebellar ataxia type III.  The patient has had ongoing issues with gait instability, he has had multiple falls.  He reports some urgency with the bowels and bladder.  He has occasional problems with choking on food, he has not had a major change in his ability to speak.  He is unemployed, he is seeking disability.  He now has an attorney for this purpose.  He does operate a motor vehicle but some days he is unable to drive due to worsening problems with double vision.  He is not sleeping well at night.  He has 3 sisters with similar afflictions.  The patient comes to this office for further  evaluation.    REVIEW OF SYSTEMS: Out of a complete 14 system review of symptoms, the patient complains only of the following symptoms, see HPI and all other reviewed systems are negative.   ALLERGIES: Allergies  Allergen Reactions  . Amoxicillin Swelling     HOME MEDICATIONS: Outpatient Medications Prior to Visit  Medication Sig Dispense Refill  . Multiple Vitamins-Minerals (MULTI COMPLETE PO) Take 1 capsule by mouth daily.    . Omega-3 Fatty Acids (OMEGA 3 PO) Take 1 capsule by mouth daily.     No facility-administered medications prior to visit.     PAST MEDICAL HISTORY: Past Medical History:  Diagnosis Date  . ADHD   . Gait abnormality 01/19/2019  . Hypertension   . 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     PAST SURGICAL HISTORY: Past Surgical History:  Procedure Laterality Date  . WISDOM TOOTH EXTRACTION     x4     FAMILY HISTORY: Family History  Problem Relation Age of Onset  . Other Mother        Cerebellar ataxia  . Breast cancer Mother   . Ataxia Mother   . Alcoholism Father   . Ataxia Sister   . Ataxia Sister   . Ataxia Sister      SOCIAL HISTORY: Social History   Socioeconomic History  . Marital status: Single    Spouse name: Not on file  . Number of children: 0  . Years of education: College  .  Highest education level: Not on file  Occupational History  . Occupation: Span  Tobacco Use  . Smoking status: Current Every Day Smoker    Packs/day: 0.50    Types: Cigarettes  . Smokeless tobacco: Never Used  Vaping Use  . Vaping Use: Former  Substance and Sexual Activity  . Alcohol use: Not Currently  . Drug use: No  . Sexual activity: Yes  Other Topics Concern  . Not on file  Social History Narrative   Lives alone   Caffeine use: Coffee daily   Works in [employer]   Right handed    Social Determinants of Health   Financial Resource Strain:   . Difficulty of Paying Living Expenses: Not on file  Food  Insecurity:   . Worried About Programme researcher, broadcasting/film/video in the Last Year: Not on file  . Ran Out of Food in the Last Year: Not on file  Transportation Needs:   . Lack of Transportation (Medical): Not on file  . Lack of Transportation (Non-Medical): Not on file  Physical Activity:   . Days of Exercise per Week: Not on file  . Minutes of Exercise per Session: Not on file  Stress:   . Feeling of Stress : Not on file  Social Connections:   . Frequency of Communication with Friends and Family: Not on file  . Frequency of Social Gatherings with Friends and Family: Not on file  . Attends Religious Services: Not on file  . Active Member of Clubs or Organizations: Not on file  . Attends Banker Meetings: Not on file  . Marital Status: Not on file  Intimate Partner Violence:   . Fear of Current or Ex-Partner: Not on file  . Emotionally Abused: Not on file  . Physically Abused: Not on file  . Sexually Abused: Not on file      PHYSICAL EXAM  Vitals:   02/02/20 1442  BP: (!) 157/102  Pulse: 96  Weight: 201 lb (91.2 kg)  Height: 6\' 1"  (1.854 m)   Body mass index is 26.52 kg/m.   Generalized: Well developed, in no acute distress   Neurological examination  Mentation: Alert oriented to time, place, history taking. Follows all commands speech and language fluent Cranial nerve II-XII: Pupils were equal round reactive to light. Extraocular movements were full, visual field were full on confrontational test. Facial sensation and strength were normal. Head turning and shoulder shrug  were normal and symmetric. Motor: The motor testing reveals 5 over 5 strength of all 4 extremities. Good symmetric motor tone is noted throughout.  Sensory: Sensory testing is intact to soft touch on all 4 extremities. No evidence of extinction is noted.  Coordination: Cerebellar testing reveals mild dysmetria finger-nose-finger and heel-to-shin bilaterally.  Gait and station: Gait is wide based. turns  slowly, instability noted. Not using assistive device. Tandem not attempted. Romberg is negative.       DIAGNOSTIC DATA (LABS, IMAGING, TESTING) - I reviewed patient records, labs, notes, testing and imaging myself where available.  No results found for: WBC, HGB, HCT, MCV, PLT No results found for: NA, K, CL, CO2, GLUCOSE, BUN, CREATININE, CALCIUM, PROT, ALBUMIN, AST, ALT, ALKPHOS, BILITOT, GFRNONAA, GFRAA No results found for: CHOL, HDL, LDLCALC, LDLDIRECT, TRIG, CHOLHDL No results found for: No results found for: VITAMINB12 No results found for: TSH    ASSESSMENT AND PLAN  34 y.o. year old male  has a past medical history of ADHD, Gait abnormality (01/19/2019), Hypertension, and SCA-3 (  spinocerebellar ataxia type 3) (HCC) (07/31/2017). here with   SCA-3 (spinocerebellar ataxia type 3) (HCC)  Elevated blood pressure reading in office without diagnosis of hypertension  Uninsured  Insomnia, unspecified type  Dysphagia, unspecified type  Urinary incontinence, unspecified type   Deejay reports that he has had progression of multiple symptoms as listed in HPI including ataxia, elevated blood pressure, difficulty swallowing, frequent falls, insomnia.  Symptoms are not new.  He is currently uninsured and unable to obtain appropriate work-up.  We have discussed options for cash pay versus patient assistance.  I have provided him with an application for [facility] patient assistance.  I would like to order imaging, labs and refer for physical, occupational and speech therapy.  He declines work-up until he is able to find a source of medical coverage.  We have discussed red flag warnings and when to seek emergency medical attention.  I have encouraged him to reach out to his primary care provider as soon as possible to address concerns of elevated blood pressure readings.  He has taken melatonin at night but does not seem to help much with sleep.  We have discussed sleep hygiene.   Fall precautions reviewed.  He is not currently using assistive devices.  I have encouraged him to use a cane or walker at all times to help prevent falls.  Healthy lifestyle habits reviewed.  Adequate hydration-diet recommended.  He will call us as soon as he finds insurance coverage so that we may continue work-up as discussed.  He has scheduled follow-up with Dr. Anne Hahn in August but may be seen sooner pending coverage.  He verbalizes understanding and agreement with this plan.  I spent 20 minutes of face-to-face and non-face-to-face time with patient.  This included previsit chart review, lab review, study review, order entry, electronic health record documentation, patient education.    Shawnie Dapper, MSN, FNP-C 02/02/2020, 3:51 PM  [facility] [address] [place], [place] [address] [phone]

## 2020-02-02 NOTE — Patient Instructions (Signed)
Below is our plan:  We will continue to monitor progression of symptoms. I have given you information regarding patient assistance program with [facility]. Consider reaching out to other health systems as well. Please call PCP asap to discuss concerns of blood pressure elevation. I am concerned that you need blood pressure medication.   Please make sure you are staying well hydrated. I recommend 50-60 ounces daily. Well balanced diet and regular exercise encouraged.    Please continue follow up with care team as directed.   Follow up with Dr Anne HahnWillis in 6 months, sooner if needed   You may receive a survey regarding today's visit. I encourage you to leave honest feed back as I do use this information to improve patient care. Thank you for seeing me today!     DASH Eating Plan DASH stands for "Dietary Approaches to Stop Hypertension." The DASH eating plan is a healthy eating plan that has been shown to reduce high blood pressure (hypertension). It may also reduce your risk for type 2 diabetes, heart disease, and stroke. The DASH eating plan may also help with weight loss. What are tips for following this plan?  General guidelines  Avoid eating more than 2,300 mg (milligrams) of salt (sodium) a day. If you have hypertension, you may need to reduce your sodium intake to 1,500 mg a day.  Limit alcohol intake to no more than 1 drink a day for nonpregnant women and 2 drinks a day for men. One drink equals 12 oz of beer, 5 oz of wine, or 1 oz of hard liquor.  Work with your health care provider to maintain a healthy body weight or to lose weight. Ask what an ideal weight is for you.  Get at least 30 minutes of exercise that causes your heart to beat faster (aerobic exercise) most days of the week. Activities may include walking, swimming, or biking.  Work with your health care provider or diet and nutrition specialist (dietitian) to adjust your eating plan to your individual calorie  needs. Reading food labels   Check food labels for the amount of sodium per serving. Choose foods with less than 5 percent of the Daily Value of sodium. Generally, foods with less than 300 mg of sodium per serving fit into this eating plan.  To find whole grains, look for the word "whole" as the first word in the ingredient list. Shopping  Buy products labeled as "low-sodium" or "no salt added."  Buy fresh foods. Avoid canned foods and premade or frozen meals. Cooking  Avoid adding salt when cooking. Use salt-free seasonings or herbs instead of table salt or sea salt. Check with your health care provider or pharmacist before using salt substitutes.  Do not fry foods. Cook foods using healthy methods such as baking, boiling, grilling, and broiling instead.  Cook with heart-healthy oils, such as olive, canola, soybean, or sunflower oil. Meal planning  Eat a balanced diet that includes: ? 5 or more servings of fruits and vegetables each day. At each meal, try to fill half of your plate with fruits and vegetables. ? Up to 6-8 servings of whole grains each day. ? Less than 6 oz of lean meat, poultry, or fish each day. A 3-oz serving of meat is about the same size as a deck of cards. One egg equals 1 oz. ? 2 servings of low-fat dairy each day. ? A serving of nuts, seeds, or beans 5 times each week. ? Heart-healthy fats. Healthy fats called  Omega-3 fatty acids are found in foods such as flaxseeds and coldwater fish, like sardines, salmon, and mackerel.  Limit how much you eat of the following: ? Canned or prepackaged foods. ? Food that is high in trans fat, such as fried foods. ? Food that is high in saturated fat, such as fatty meat. ? Sweets, desserts, sugary drinks, and other foods with added sugar. ? Full-fat dairy products.  Do not salt foods before eating.  Try to eat at least 2 vegetarian meals each week.  Eat more home-cooked food and less restaurant, buffet, and fast  food.  When eating at a restaurant, ask that your food be prepared with less salt or no salt, if possible. What foods are recommended? The items listed may not be a complete list. Talk with your dietitian about what dietary choices are best for you. Grains Whole-grain or whole-wheat bread. Whole-grain or whole-wheat pasta. Brown rice. [place]row. Bulgur. Whole-grain and low-sodium cereals. Pita bread. Low-fat, low-sodium crackers. Whole-wheat flour tortillas. Vegetables Fresh or frozen vegetables (raw, steamed, roasted, or grilled). Low-sodium or reduced-sodium tomato and vegetable juice. Low-sodium or reduced-sodium tomato sauce and tomato paste. Low-sodium or reduced-sodium canned vegetables. Fruits All fresh, dried, or frozen fruit. Canned fruit in natural juice (without added sugar). Meat and other protein foods Skinless chicken or [place]. Ground chicken or [place]. Pork with fat trimmed off. Fish and seafood. Egg whites. Dried beans, peas, or lentils. Unsalted nuts, nut butters, and seeds. Unsalted canned beans. Lean cuts of beef with fat trimmed off. Low-sodium, lean deli meat. Dairy Low-fat (1%) or fat-free (skim) milk. Fat-free, low-fat, or reduced-fat cheeses. Nonfat, low-sodium ricotta or cottage cheese. Low-fat or nonfat yogurt. Low-fat, low-sodium cheese. Fats and oils Soft margarine without trans fats. Vegetable oil. Low-fat, reduced-fat, or light mayonnaise and salad dressings (reduced-sodium). Canola, safflower, olive, soybean, and sunflower oils. Avocado. Seasoning and other foods Herbs. Spices. Seasoning mixes without salt. Unsalted popcorn and pretzels. Fat-free sweets. What foods are not recommended? The items listed may not be a complete list. Talk with your dietitian about what dietary choices are best for you. Grains Baked goods made with fat, such as croissants, muffins, or some breads. Dry pasta or rice meal packs. Vegetables Creamed or fried vegetables. Vegetables  in a cheese sauce. Regular canned vegetables (not low-sodium or reduced-sodium). Regular canned tomato sauce and paste (not low-sodium or reduced-sodium). Regular tomato and vegetable juice (not low-sodium or reduced-sodium). Angie Fava. Olives. Fruits Canned fruit in a light or heavy syrup. Fried fruit. Fruit in cream or butter sauce. Meat and other protein foods Fatty cuts of meat. Ribs. Fried meat. Berniece Salines. Sausage. Bologna and other processed lunch meats. Salami. Fatback. Hotdogs. Bratwurst. Salted nuts and seeds. Canned beans with added salt. Canned or smoked fish. Whole eggs or egg yolks. Chicken or [place] with skin. Dairy Whole or 2% milk, cream, and half-and-half. Whole or full-fat cream cheese. Whole-fat or sweetened yogurt. Full-fat cheese. Nondairy creamers. Whipped toppings. Processed cheese and cheese spreads. Fats and oils Butter. Stick margarine. Lard. Shortening. Ghee. Bacon fat. Tropical oils, such as coconut, palm kernel, or palm oil. Seasoning and other foods Salted popcorn and pretzels. Onion salt, garlic salt, seasoned salt, table salt, and sea salt. Worcestershire sauce. Tartar sauce. Barbecue sauce. Teriyaki sauce. Soy sauce, including reduced-sodium. Steak sauce. Canned and packaged gravies. Fish sauce. Oyster sauce. Cocktail sauce. Horseradish that you find on the shelf. Ketchup. Mustard. Meat flavorings and tenderizers. Bouillon cubes. Hot sauce and Tabasco sauce. Premade or packaged marinades. Premade  or packaged taco seasonings. Relishes. Regular salad dressings. Where to find more information:  National Heart, Lung, and Blood Institute: PopSteam.is  American Heart Association: www.heart.org Summary  The DASH eating plan is a healthy eating plan that has been shown to reduce high blood pressure (hypertension). It may also reduce your risk for type 2 diabetes, heart disease, and stroke.  With the DASH eating plan, you should limit salt (sodium) intake to 2,300 mg a  day. If you have hypertension, you may need to reduce your sodium intake to 1,500 mg a day.  When on the DASH eating plan, aim to eat more fresh fruits and vegetables, whole grains, lean proteins, low-fat dairy, and heart-healthy fats.  Work with your health care provider or diet and nutrition specialist (dietitian) to adjust your eating plan to your individual calorie needs. This information is not intended to replace advice given to you by your health care provider. Make sure you discuss any questions you have with your health care provider. Document Revised: 02/28/2017 Document Reviewed: 03/11/2016 Elsevier Patient Education  2020 [employer].     Hypertension, Adult High blood pressure (hypertension) is when the force of blood pumping through the arteries is too strong. The arteries are the blood vessels that carry blood from the heart throughout the body. Hypertension forces the heart to work harder to pump blood and may cause arteries to become narrow or stiff. Untreated or uncontrolled hypertension can cause a heart attack, heart failure, a stroke, kidney disease, and other problems. A blood pressure reading consists of a higher number over a lower number. Ideally, your blood pressure should be below 120/80. The first ("top") number is called the systolic pressure. It is a measure of the pressure in your arteries as your heart beats. The second ("bottom") number is called the diastolic pressure. It is a measure of the pressure in your arteries as the heart relaxes. What are the causes? The exact cause of this condition is not known. There are some conditions that result in or are related to high blood pressure. What increases the risk? Some risk factors for high blood pressure are under your control. The following factors may make you more likely to develop this condition:  Smoking.  Having type 2 diabetes mellitus, high cholesterol, or both.  Not getting enough exercise or physical  activity.  Being overweight.  Having too much fat, sugar, calories, or salt (sodium) in your diet.  Drinking too much alcohol. Some risk factors for high blood pressure may be difficult or impossible to change. Some of these factors include:  Having chronic kidney disease.  Having a family history of high blood pressure.  Age. Risk increases with age.  Race. You may be at higher risk if you are African American.  Gender. Men are at higher risk than women before age 26. After age 12, women are at higher risk than men.  Having obstructive sleep apnea.  Stress. What are the signs or symptoms? High blood pressure may not cause symptoms. Very high blood pressure (hypertensive crisis) may cause:  Headache.  Anxiety.  Shortness of breath.  Nosebleed.  Nausea and vomiting.  Vision changes.  Severe chest pain.  Seizures. How is this diagnosed? This condition is diagnosed by measuring your blood pressure while you are seated, with your arm resting on a flat surface, your legs uncrossed, and your feet flat on the floor. The cuff of the blood pressure monitor will be placed directly against the skin of your upper  arm at the level of your heart. It should be measured at least twice using the same arm. Certain conditions can cause a difference in blood pressure between your right and left arms. Certain factors can cause blood pressure readings to be lower or higher than normal for a short period of time:  When your blood pressure is higher when you are in a health care provider's office than when you are at home, this is called white coat hypertension. Most people with this condition do not need medicines.  When your blood pressure is higher at home than when you are in a health care provider's office, this is called masked hypertension. Most people with this condition may need medicines to control blood pressure. If you have a high blood pressure reading during one visit or you have  normal blood pressure with other risk factors, you may be asked to:  Return on a different day to have your blood pressure checked again.  Monitor your blood pressure at home for 1 week or longer. If you are diagnosed with hypertension, you may have other blood or imaging tests to help your health care provider understand your overall risk for other conditions. How is this treated? This condition is treated by making healthy lifestyle changes, such as eating healthy foods, exercising more, and reducing your alcohol intake. Your health care provider may prescribe medicine if lifestyle changes are not enough to get your blood pressure under control, and if:  Your systolic blood pressure is above 130.  Your diastolic blood pressure is above 80. Your personal target blood pressure may vary depending on your medical conditions, your age, and other factors. Follow these instructions at home: Eating and drinking   Eat a diet that is high in fiber and potassium, and low in sodium, added sugar, and fat. An example eating plan is called the DASH (Dietary Approaches to Stop Hypertension) diet. To eat this way: ? Eat plenty of fresh fruits and vegetables. Try to fill one half of your plate at each meal with fruits and vegetables. ? Eat whole grains, such as whole-wheat pasta, brown rice, or whole-grain bread. Fill about one fourth of your plate with whole grains. ? Eat or drink low-fat dairy products, such as skim milk or low-fat yogurt. ? Avoid fatty cuts of meat, processed or cured meats, and poultry with skin. Fill about one fourth of your plate with lean proteins, such as fish, chicken without skin, beans, eggs, or tofu. ? Avoid pre-made and processed foods. These tend to be higher in sodium, added sugar, and fat.  Reduce your daily sodium intake. Most people with hypertension should eat less than 1,500 mg of sodium a day.  Do not drink alcohol if: ? Your health care provider tells you not to  drink. ? You are pregnant, may be pregnant, or are planning to become pregnant.  If you drink alcohol: ? Limit how much you use to:  0-1 drink a day for women.  0-2 drinks a day for men. ? Be aware of how much alcohol is in your drink. In the [place] one drink equals one 12 oz bottle of beer (355 mL), one 5 oz glass of wine (148 mL), or one 1 oz glass of hard liquor (44 mL). Lifestyle   Work with your health care provider to maintain a healthy body weight or to lose weight. Ask what an ideal weight is for you.  Get at least 30 minutes of exercise most days of the  week. Activities may include walking, swimming, or biking.  Include exercise to strengthen your muscles (resistance exercise), such as Pilates or lifting weights, as part of your weekly exercise routine. Try to do these types of exercises for 30 minutes at least 3 days a week.  Do not use any products that contain nicotine or tobacco, such as cigarettes, e-cigarettes, and chewing tobacco. If you need help quitting, ask your health care provider.  Monitor your blood pressure at home as told by your health care provider.  Keep all follow-up visits as told by your health care provider. This is important. Medicines  Take over-the-counter and prescription medicines only as told by your health care provider. Follow directions carefully. Blood pressure medicines must be taken as prescribed.  Do not skip doses of blood pressure medicine. Doing this puts you at risk for problems and can make the medicine less effective.  Ask your health care provider about side effects or reactions to medicines that you should watch for. Contact a health care provider if you:  Think you are having a reaction to a medicine you are taking.  Have headaches that keep coming back (recurring).  Feel dizzy.  Have swelling in your ankles.  Have trouble with your vision. Get help right away if you:  Develop a severe headache or confusion.  Have  unusual weakness or numbness.  Feel faint.  Have severe pain in your chest or abdomen.  Vomit repeatedly.  Have trouble breathing. Summary  Hypertension is when the force of blood pumping through your arteries is too strong. If this condition is not controlled, it may put you at risk for serious complications.  Your personal target blood pressure may vary depending on your medical conditions, your age, and other factors. For most people, a normal blood pressure is less than 120/80.  Hypertension is treated with lifestyle changes, medicines, or a combination of both. Lifestyle changes include losing weight, eating a healthy, low-sodium diet, exercising more, and limiting alcohol. This information is not intended to replace advice given to you by your health care provider. Make sure you discuss any questions you have with your health care provider. Document Revised: 11/26/2017 Document Reviewed: 11/26/2017 Elsevier Patient Education  2020 Elsevier Inc.    Ataxia   Ataxia is a condition that causes unsteadiness when walking and standing, poor coordination of body movements, and difficulty keeping a straight (upright) posture. It occurs because of a problem with the part of the brain that controls coordination and stability (cerebellum). Ataxia can develop later in life (acquired ataxia), during your 20s or 30s or even into your 60s or later. This type of ataxia develops when another medical condition, such as a stroke, damages the cerebellum. Ataxia also may be present early in life (non-acquired ataxia). There are two main types of non-acquired ataxia:  Congenital. This type is present at birth.  Hereditary. This type is passed from parent to child. The most common form of hereditary non-acquired ataxia is Friedreich ataxia. What are the causes? Acquired ataxia may be caused by:  Changes in the nervous system (neurodegenerative changes).  Changes throughout the body (systemic  disorders).  A lot of exposure to: ? Certain medicines such as phenytoin and lithium. ? Solvents. These are cleaning fluids such as paint thinner, nail polish remover, carpet cleaner, and degreasers.  Alcohol abuse (alcoholism).  Medical conditions, such as: ? Celiac disease. ? Hypothyroidism. ? A lack (deficiency) of vitamin E, vitamin B12, or thiamine. ? Brain tumors. ? Multiple  sclerosis. ? Cerebral palsy. ? Stroke. ? Paraneoplastic syndromes. ? Viral infections. ? Head injury. ? Malnutrition. Congenital and hereditary ataxia are caused by problems that are present in genes before birth. What are the signs or symptoms? Signs and symptoms of ataxia vary depending on the cause. They may include:  Being unsteady.  Walking with the legs wide apart (wide stance) to keep one's balance.  Uncontrolled shaking (tremor).  Poorly coordinated body movements.  Difficulty maintaining an upright posture.  Fatigue.  Changes in speech.  Changes in vision.  Involuntary eye movements (nystagmus).  Difficulty swallowing.  Difficulty writing.  Muscle tightening that you cannot control (muscle spasms). How is this diagnosed? Ataxia may be diagnosed based on:  Your personal and family medical history.  A physical exam.  Imaging tests, such as a CT scan or MRI.  Spinal tap (lumbar puncture). This procedure involves using a needle to take a sample of the fluid around your brain and spinal cord.  Genetic testing. How is this treated? The underlying condition that causes your ataxia needs to be treated. If the cause is a brain tumor, you may need surgery. Treatment also focuses on helping you live with ataxia and improving your quality of life (supportive treatments). This may involve:  Learning ways to improve coordination and move around more carefully (physical therapy).  Learning ways to improve your ability to do daily tasks, such as bathing and feeding yourself  (occupational therapy).  Using devices to help you move around, eat, or communicate (assistive devices), such as a walker, modified eating utensils, and communication aids.  Learning ways to improve speech and swallowing (speech therapy). Follow these instructions at home: Preventing falls  Lie down right away if you become very unsteady, dizzy, or nauseous, or if you feel like you are going to faint. Do not get up until all of those feelings pass.  Keep your home well-lit. Use night-lights as needed.  Remove tripping hazards, such as rugs, cords, and clutter.  Install grab bars by the toilet and in the tub and shower.  Use assistive devices such as a cane, walker, or wheelchair as needed to keep your balance. General instructions  Do not drink alcohol.  Ask your health care provider what activities are safe for you, and what activities you should avoid.  Take over-the-counter and prescription medicines only as told by your health care provider. Get help right away if you:  Have unsteadiness that suddenly worsens.  Have any of these: ? Severe headaches. ? Chest pain. ? Abdominal pain. ? Weakness or numbness on one side of your body. ? Vision problems. ? Difficulty speaking. ? An irregular heartbeat. ? A very fast pulse.  Feel confused. Summary  Ataxia is a condition that causes unsteadiness when walking and standing, poor coordination of body movements, and difficulty keeping a straight (upright) posture.  Ataxia occurs because of a problem with the part of the brain that controls coordination and stability (cerebellum).  The underlying condition that causes your ataxia needs to be treated. Treatment also focuses on helping you live with ataxia and improving your quality of life (supportive treatments).  Lie down right away if you become very unsteady, dizzy, or nauseous, or if you feel like you are going to faint. This information is not intended to replace advice  given to you by your health care provider. Make sure you discuss any questions you have with your health care provider. Document Revised: 02/28/2017 Document Reviewed: 01/17/2017 Elsevier Patient Education  2020 Elsevier Inc.  

## 2020-02-03 NOTE — Progress Notes (Signed)
I have read the note, and I agree with the clinical assessment and plan.  Benedetto Ryder K Adriano Bischof   

## 2020-07-24 DIAGNOSIS — Z0271 Encounter for disability determination: Secondary | ICD-10-CM

## 2020-10-30 ENCOUNTER — Telehealth: Payer: Self-pay

## 2020-10-30 ENCOUNTER — Ambulatory Visit: Payer: Self-pay | Admitting: Neurology

## 2020-10-30 ENCOUNTER — Encounter: Payer: Self-pay | Admitting: Neurology

## 2020-10-30 VITALS — BP 139/91 | HR 102 | Ht 73.0 in | Wt 190.8 lb

## 2020-10-30 DIAGNOSIS — G118 Other hereditary ataxias: Secondary | ICD-10-CM

## 2020-10-30 MED ORDER — GABAPENTIN 300 MG PO CAPS: 300.0000 mg | ORAL_CAPSULE | Freq: Every day | ORAL | 2 refills | Status: AC

## 2020-10-30 NOTE — Progress Notes (Signed)
Reason for visit: Spinocerebellar atrophy type III  Jonathan Hopkins is an 35 y.o. male  History of present illness:  Jonathan Hopkins is a 35 year old right-handed white male with a history of SCA type III.  The patient has finally been able to get disability.  He continues to have significant gait ataxia, he has fallen on occasion.  He fractured the eighth and ninth ribs on the left 3 to 4 months ago, he has recovered from this.  He is reporting some difficulty with choking with swallowing.  He reports some slurring of his speech and ongoing double vision.  He will be seen through ophthalmology in the next month.  The patient reports pain at the base of the neck without pain going down the arms.  He reports some left hand numbness and some numbness and tingling and stinging sensations in the feet at night.  He is having difficulty sleeping, he is on amitriptyline which allows him to sleep about 4 hours.  He does occasionally have some cramps in the muscles of the hands and in the legs.  He has had some urinary urgency but the oxybutynin does help this.  Unfortunately, when he obtained insurance through disability, the [facility] system is not one of the approved providers.  The patient indicates that he will need to find another doctor to follow him for his ataxia.  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 His smoking use included cigarettes. He smoked an average of .5 packs per day. He has never used smokeless tobacco. He  reports previous alcohol use. He reports that he does not use drugs.    Allergies  Allergen Reactions   Amoxicillin Swelling    Medications:  Prior to Admission medications   Medication Sig Start Date End Date Taking? Authorizing Provider  amitriptyline (ELAVIL) 50 MG tablet Take 50 mg by mouth at bedtime. 10/24/20  Yes [provider]  amLODipine (NORVASC) 10 MG tablet Take 10 mg by mouth daily. 10/24/20  Yes [provider]  Multiple Vitamins-Minerals (MULTI COMPLETE PO) Take 1 capsule by mouth daily.   Yes [provider]  Omega-3 Fatty Acids (OMEGA 3 PO) Take 1 capsule by mouth daily.   Yes [provider]  oxybutynin (DITROPAN-XL) 5 MG 24 hr tablet Take 5 mg by mouth daily. 10/17/20  Yes [provider]  terbinafine (LAMISIL) 250 MG tablet Take 250 mg by mouth daily. 10/24/20  Yes [provider]    ROS:  Out of a complete 14 system review of symptoms, the patient complains only of the following symptoms, and all other reviewed systems are negative.  Walking difficulty Foot discomfort, left hand numbness Slurred speech Double vision  Blood pressure (!) 139/91, pulse (!) 102, height 6\' 1"  (1.854 m), weight 190 lb 12.8 oz (86.5 kg).  Physical Exam  General:  The patient is alert and cooperative at the time of the examination.  Skin: No significant peripheral edema is noted.   Neurologic Exam  Mental status: The patient is alert and oriented x 3 at the time of the examination. The patient has apparent normal recent and remote memory, with an apparently normal attention span and concentration ability.   Cranial nerves: Facial symmetry is present. Speech is normal, no aphasia or dysarthria is noted. Extraocular movements are full. Visual fields are full.  Motor: The patient has good strength in all 4 extremities.  Sensory examination: Soft touch sensation is symmetric on the face, arms, and legs.  Coordination: The  patient mild dysmetria with finger-nose-finger and heel-to-shin bilaterally.  Gait and station: The patient has a wide-based, ataxic gait.  The patient normally walks with a cane.  Tandem gait was not attempted.  Reflexes: Deep tendon reflexes are symmetric, but are brisk throughout with exception of some relative reduction of the ankle jerk reflexes bilaterally.   Assessment/Plan:  SCA type III  2.  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  He has ongoing chronic gait disorder, risk for falls.  The patient is no longer in network with his insurance, he requests a referral to another neurologist.  I will set the patient up to see Dr. Stacy Gardner.  The patient will follow up here if needed.  He will be given a prescription for gabapentin for his neuropathy pain and to help him sleep.  Jonathan Palau MD 10/30/2020 12:00 PM  [facility] [address] [place], [place] [address]  Phone [phone] Fax [phone]

## 2020-10-30 NOTE — Telephone Encounter (Signed)
Referral to Dr. Stacy Gardner sent to [facility]. P: [id number].

## 2021-12-05 ENCOUNTER — Emergency Department (HOSPITAL_BASED_OUTPATIENT_CLINIC_OR_DEPARTMENT_OTHER): Payer: Medicaid Other

## 2021-12-05 ENCOUNTER — Other Ambulatory Visit: Payer: Self-pay

## 2021-12-05 ENCOUNTER — Encounter (HOSPITAL_BASED_OUTPATIENT_CLINIC_OR_DEPARTMENT_OTHER): Payer: Self-pay | Admitting: Urology

## 2021-12-05 ENCOUNTER — Observation Stay (HOSPITAL_BASED_OUTPATIENT_CLINIC_OR_DEPARTMENT_OTHER)
Admission: EM | Admit: 2021-12-05 | Discharge: 2021-12-07 | Disposition: A | Discharge status: Home / Self Care | Payer: Medicaid Other | Attending: Surgery | Admitting: Surgery

## 2021-12-05 DIAGNOSIS — Z79899 Other long term (current) drug therapy: Secondary | ICD-10-CM | POA: Diagnosis not present

## 2021-12-05 DIAGNOSIS — Z87891 Personal history of nicotine dependence: Secondary | ICD-10-CM | POA: Diagnosis not present

## 2021-12-05 DIAGNOSIS — R1031 Right lower quadrant pain: Secondary | ICD-10-CM | POA: Diagnosis present

## 2021-12-05 DIAGNOSIS — K3580 Unspecified acute appendicitis: Principal | ICD-10-CM

## 2021-12-05 DIAGNOSIS — I10 Essential (primary) hypertension: Secondary | ICD-10-CM | POA: Insufficient documentation

## 2021-12-05 DIAGNOSIS — K37 Unspecified appendicitis: Secondary | ICD-10-CM

## 2021-12-05 DIAGNOSIS — K3531 Acute appendicitis with localized peritonitis and gangrene, without perforation: Principal | ICD-10-CM | POA: Insufficient documentation

## 2021-12-05 LAB — COMPREHENSIVE METABOLIC PANEL WITH GFR
ALT: 18 U/L (ref 0–44)
AST: 20 U/L (ref 15–41)
Albumin: 4.3 g/dL (ref 3.5–5.0)
Alkaline Phosphatase: 91 U/L (ref 38–126)
Anion gap: 9 (ref 5–15)
BUN: 17 mg/dL (ref 6–20)
CO2: 24 mmol/L (ref 22–32)
Calcium: 9.1 mg/dL (ref 8.9–10.3)
Chloride: 99 mmol/L (ref 98–111)
Creatinine, Ser: 1.11 mg/dL (ref 0.61–1.24)
GFR, Estimated: >60 mL/min
Glucose, Bld: 110 mg/dL — ABNORMAL HIGH (ref 70–99)
Potassium: 3.5 mmol/L (ref 3.5–5.1)
Sodium: 132 mmol/L — ABNORMAL LOW (ref 135–145)
Total Bilirubin: 2.1 mg/dL — ABNORMAL HIGH (ref 0.3–1.2)
Total Protein: 7.6 g/dL (ref 6.5–8.1)

## 2021-12-05 LAB — CBC
HCT: 38.2 % — ABNORMAL LOW (ref 39.0–52.0)
Hemoglobin: 14.2 g/dL (ref 13.0–17.0)
MCH: 33.3 pg (ref 26.0–34.0)
MCHC: 37.2 g/dL — ABNORMAL HIGH (ref 30.0–36.0)
MCV: 89.5 fL (ref 80.0–100.0)
Platelets: 272 K/uL (ref 150–400)
RBC: 4.27 MIL/uL (ref 4.22–5.81)
RDW: 12.2 % (ref 11.5–15.5)
WBC: 25.2 K/uL — ABNORMAL HIGH (ref 4.0–10.5)
nRBC: 0 % (ref 0.0–0.2)

## 2021-12-05 LAB — LIPASE, BLOOD: Lipase: 43 U/L (ref 11–51)

## 2021-12-05 MED ORDER — ONDANSETRON HCL 4 MG/2ML IJ SOLN
4.0000 mg | Freq: Once | INTRAMUSCULAR | Status: DC
  Filled 2021-12-05: qty 2

## 2021-12-05 MED ORDER — IOHEXOL 300 MG/ML  SOLN
100.0000 mL | Freq: Once | INTRAMUSCULAR | Status: AC | PRN
  Administered 2021-12-05: 100 mL via INTRAVENOUS

## 2021-12-05 MED ORDER — LACTATED RINGERS IV BOLUS
1000.0000 mL | Freq: Once | INTRAVENOUS | Status: AC
  Administered 2021-12-06: 1000 mL via INTRAVENOUS

## 2021-12-05 MED ORDER — SODIUM CHLORIDE 0.9 % IV SOLN: INTRAVENOUS | Status: DC | PRN

## 2021-12-05 MED ORDER — METRONIDAZOLE 500 MG/100ML IV SOLN
500.0000 mg | Freq: Once | INTRAVENOUS | Status: AC
  Administered 2021-12-06: 500 mg via INTRAVENOUS
  Filled 2021-12-05: qty 100

## 2021-12-05 MED ORDER — GABAPENTIN 300 MG PO CAPS
300.0000 mg | ORAL_CAPSULE | Freq: Two times a day (BID) | ORAL | Status: DC
  Administered 2021-12-05 – 2021-12-07 (×3): 300 mg via ORAL
  Filled 2021-12-05 (×3): qty 1

## 2021-12-05 MED ORDER — MORPHINE SULFATE (PF) 4 MG/ML IV SOLN
4.0000 mg | Freq: Once | INTRAVENOUS | Status: AC
  Administered 2021-12-05: 4 mg via INTRAVENOUS
  Filled 2021-12-05: qty 1

## 2021-12-05 MED ORDER — SODIUM CHLORIDE 0.9 % IV SOLN
1.0000 g | INTRAVENOUS | Status: DC
  Administered 2021-12-05: 1 g via INTRAVENOUS
  Filled 2021-12-05: qty 10

## 2021-12-05 MED ORDER — AMITRIPTYLINE HCL 50 MG PO TABS
50.0000 mg | ORAL_TABLET | Freq: Every day | ORAL | Status: DC
  Administered 2021-12-06 (×2): 50 mg via ORAL
  Filled 2021-12-05 (×3): qty 1

## 2021-12-05 MED ORDER — SODIUM CHLORIDE 0.9 % IV SOLN
2.0000 g | INTRAVENOUS | Status: DC
  Administered 2021-12-06 – 2021-12-07 (×2): 2 g via INTRAVENOUS
  Filled 2021-12-05 (×2): qty 20

## 2021-12-05 NOTE — ED Notes (Signed)
Pt is aware he needs a urine sample 

## 2021-12-05 NOTE — ED Notes (Signed)
Patient transported to CT 

## 2021-12-05 NOTE — ED Notes (Signed)
ED TO INPATIENT HANDOFF REPORT  ED Nurse Name and Phone #: Joseph Art [phone]  S Name/Age/Gender Jonathan Hopkins Needs 36 y.o. male Room/Bed: MH12/MH12  Code Status   Code Status: Not on file  Home/SNF/Other Home Patient oriented to: self, place, time, and situation Is this baseline? Yes   Triage Complete: Triage complete  Chief Complaint Appendicitis [K37]  Triage Note RLQ abd pain with nausea that started yesterday, unknown fever at home  States diarrhea as well, decreased appetite   Allergies Allergies  Allergen Reactions   Amoxicillin Swelling    Level of Care/Admitting Diagnosis ED Disposition     ED Disposition  Admit   Condition  --   Comment  Hospital Area: [facility] [[id number]]  Level of Care: Med-Surg [16]  May admit patient to [facility] or [facility] if equivalent level of care is available:: Yes  Interfacility transfer: Yes  Covid Evaluation: Asymptomatic - no recent exposure (last 10 days) testing not required  Diagnosis: Appendicitis [235361]  Admitting Physician: CCS, MD [[id number]]  Attending Physician: CCS, MD [[id number]]  Certification:: I certify this patient is being admitted for an inpatient-only procedure  Estimated Length of Stay: 5          B Medical/Surgery History Past Medical History:  Diagnosis Date   ADHD    Gait abnormality 01/19/2019   Hypertension    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   Past Surgical History:  Procedure Laterality Date   WISDOM TOOTH EXTRACTION     x4     A IV Location/Drains/Wounds Patient Lines/Drains/Airways Status     Active Line/Drains/Airways     Name Placement date Placement time Site Days   Peripheral IV 12/05/21 20 G Right Antecubital 12/05/21  2041  Antecubital  less than 1            Intake/Output Last 24 hours No intake or output data in the 24 hours ending 12/05/21 2238  Labs/Imaging Results for orders placed or performed during the  hospital encounter of 12/05/21 (from the past 48 hour(s))  Lipase, blood     Status: None   Collection Time: 12/05/21  8:16 PM  Result Value Ref Range   Lipase 43 11 - 51 U/L    Comment: Performed at [facility], [address] [place], [place] [address]  Comprehensive metabolic panel     Status: Abnormal   Collection Time: 12/05/21  8:16 PM  Result Value Ref Range   Sodium 132 (L) 135 - 145 mmol/L   Potassium 3.5 3.5 - 5.1 mmol/L   Chloride 99 98 - 111 mmol/L   CO2 24 22 - 32 mmol/L   Glucose, Bld 110 (H) 70 - 99 mg/dL    Comment: Glucose reference range applies only to samples taken after fasting for at least 8 hours.   BUN 17 6 - 20 mg/dL   Creatinine, Ser 4.00 0.61 - 1.24 mg/dL   Calcium 9.1 8.9 - 86.7 mg/dL   Total Protein 7.6 6.5 - 8.1 g/dL   Albumin 4.3 3.5 - 5.0 g/dL   AST 20 15 - 41 U/L   ALT 18 0 - 44 U/L   Alkaline Phosphatase 91 38 - 126 U/L   Total Bilirubin 2.1 (H) 0.3 - 1.2 mg/dL   GFR, Estimated >61 >95 mL/min    Comment: (NOTE) Calculated using the CKD-EPI Creatinine Equation (2021)    Anion gap 9 5 - 15    Comment: Performed at  [facility], [address] [place], [place] [address]  CBC     Status: Abnormal   Collection Time: 12/05/21  8:16 PM  Result Value Ref Range   WBC 25.2 (H) 4.0 - 10.5 K/uL   RBC 4.27 4.22 - 5.81 MIL/uL   Hemoglobin 14.2 13.0 - 17.0 g/dL   HCT 62.3 (L) 76.2 - 83.1 %   MCV 89.5 80.0 - 100.0 fL   MCH 33.3 26.0 - 34.0 pg   MCHC 37.2 (H) 30.0 - 36.0 g/dL   RDW 51.7 61.6 - 07.3 %   Platelets 272 150 - 400 K/uL   nRBC 0.0 0.0 - 0.2 %    Comment: Performed at [facility], [address] [place], [place] [address]   CT ABDOMEN PELVIS W CONTRAST  Result Date: 12/05/2021 CLINICAL DATA:  RLQ abdominal pain (Age >= 14y). 36 y/o male. RLQ abd pain with nausea that started yesterday, unknown fever at home States diarrhea as well, decreased appetite EXAM: CT ABDOMEN AND PELVIS WITH CONTRAST TECHNIQUE:  Multidetector CT imaging of the abdomen and pelvis was performed using the standard protocol following bolus administration of intravenous contrast. RADIATION DOSE REDUCTION: This exam was performed according to the departmental dose-optimization program which includes automated exposure control, adjustment of the mA and/or kV according to patient size and/or use of iterative reconstruction technique. CONTRAST:  OMNIPAQUE IOHEXOL 300 MG/ML  SOLN COMPARISON:  None Available. FINDINGS: Lower chest: Right lower lobe subsegmental atelectasis. No acute abnormality. Hepatobiliary: Mildly enlarged hepatic parenchyma. No focal liver abnormality. Calcified gallstone noted within the gallbladder lumen. No gallbladder wall thickening or pericholecystic fluid. No biliary dilatation. Pancreas: No focal lesion. Normal pancreatic contour. No surrounding inflammatory changes. No main pancreatic ductal dilatation. Spleen: Normal in size without focal abnormality. Adrenals/Urinary Tract: No adrenal nodule bilaterally. Bilateral kidneys enhance symmetrically. Enlarged right kidney compared to the left. No hydronephrosis. No hydroureter. The urinary bladder is unremarkable. Stomach/Bowel: Stomach is within normal limits. No evidence of bowel wall thickening or dilatation. Enlarged appendiceal caliber measuring up to 1.3 cm with associated periappendiceal fat stranding and free fluid. Several appendicoliths are noted within the appendiceal lumen. No definite appendiceal wall discontinuity. Vascular/Lymphatic: No abdominal aorta or iliac aneurysm. Mild atherosclerotic plaque of the aorta and its branches. No abdominal, pelvic, or inguinal lymphadenopathy. Reproductive: Prostate is unremarkable. Other: Trace right lower quadrant free fluid. No intraperitoneal free gas. No organized fluid collection. Musculoskeletal: Small fat containing umbilical hernia. No suspicious lytic or blastic osseous lesions. No acute displaced fracture.  Multilevel degenerative changes of the spine. IMPRESSION: 1. Non-perforated acute appendicitis with associated appendicoliths. 2. Cholelithiasis no CT finding of acute cholecystitis. 3. Mild hepatomegaly. Electronically Signed   By: Tish Frederickson M.D.   On: 12/05/2021 21:31    Pending Labs Unresulted Labs (From admission, onward)     Start     Ordered   12/05/21 2012  Urinalysis, Routine w reflex microscopic  Once,   URGENT        12/05/21 2012            Vitals/Pain Today's Vitals   12/05/21 2009 12/05/21 2011 12/05/21 2058 12/05/21 2127  BP:  134/87  122/76  Pulse:  (!) 128  (!) 118  Resp:  16  18  Temp:  98.6 F (37 C)    TempSrc:  Oral    SpO2:  96%  98%  Weight: 86.5 kg     Height: 6\' 1"  (1.854 m)  PainSc: 10-Worst pain ever  0-No pain     Isolation Precautions No active isolations  Medications Medications  cefTRIAXone (ROCEPHIN) 1 g in sodium chloride 0.9 % 100 mL IVPB (1 g Intravenous New Bag/Given 12/05/21 2217)  gabapentin (NEURONTIN) capsule 300 mg (300 mg Oral Given 12/05/21 2209)  lactated ringers bolus 1,000 mL (has no administration in time range)  metroNIDAZOLE (FLAGYL) IVPB 500 mg (has no administration in time range)  amitriptyline (ELAVIL) tablet 50 mg (has no administration in time range)  0.9 %  sodium chloride infusion ( Intravenous New Bag/Given 12/05/21 2208)  ondansetron (ZOFRAN) injection 4 mg (has no administration in time range)  iohexol (OMNIPAQUE) 300 MG/ML solution 100 mL (100 mLs Intravenous Contrast Given 12/05/21 2101)  morphine (PF) 4 MG/ML injection 4 mg (4 mg Intravenous Given 12/05/21 2212)    Mobility walks with device Moderate fall risk   Focused Assessments N/A   R Recommendations: See Admitting Provider Note  Report given to:   Additional Notes: Pt A/O X 4 has spinocerebellar ataxia and uses a cane at baseline. ABX running.

## 2021-12-05 NOTE — ED Provider Notes (Signed)
[facility] EMERGENCY DEPARTMENT Provider Note   CSN: [id number] Arrival date & time: 12/05/21  1954     History Chief Complaint  Patient presents with   Abdominal Pain    HPI Jonathan Hopkins is a 36 y.o. male presenting for abdominal pain.  He has an extensive medical history including a history of spinal cerebellar atrophy.  He has focal right lower quadrant pain that started 4 hours prior to arrival, no known sick contacts.  No history of similar. Patient's recorded medical, surgical, social, medication list and allergies were reviewed in the Snapshot window as part of the initial history.   Review of Systems   Review of Systems  Constitutional:  Negative for chills and fever.  HENT:  Negative for ear pain and sore throat.   Eyes:  Negative for pain and visual disturbance.  Respiratory:  Negative for cough and shortness of breath.   Cardiovascular:  Negative for chest pain and palpitations.  Gastrointestinal:  Positive for abdominal pain and nausea. Negative for vomiting.  Genitourinary:  Negative for dysuria and hematuria.  Musculoskeletal:  Negative for arthralgias and back pain.  Skin:  Negative for color change and rash.  Neurological:  Negative for seizures and syncope.  All other systems reviewed and are negative.   Physical Exam Updated Vital Signs BP 119/78 (BP Location: Left Arm)   Pulse (!) 118   Temp 100.1 F (37.8 C)   Resp 18   Ht 6\' 1"  (1.854 m)   Wt 86.5 kg   SpO2 96%   BMI 25.16 kg/m  Physical Exam Vitals and nursing note reviewed.  Constitutional:      General: He is not in acute distress.    Appearance: He is well-developed.  HENT:     Head: Normocephalic and atraumatic.  Eyes:     Conjunctiva/sclera: Conjunctivae normal.  Cardiovascular:     Rate and Rhythm: Normal rate and regular rhythm.     Heart sounds: No murmur heard. Pulmonary:     Effort: Pulmonary effort is normal. No respiratory distress.     Breath sounds: Normal  breath sounds.  Abdominal:     Palpations: Abdomen is soft.     Tenderness: There is abdominal tenderness in the right lower quadrant and periumbilical area.  Musculoskeletal:        General: No swelling.     Cervical back: Neck supple.  Skin:    General: Skin is warm and dry.     Capillary Refill: Capillary refill takes less than 2 seconds.  Neurological:     Mental Status: He is alert.  Psychiatric:        Mood and Affect: Mood normal.      ED Course/ Medical Decision Making/ A&P Clinical Course as of 12/06/21 0005  Wed Dec 05, 2021  2134 WBC(!): 25.2 [CA]  2134 Sodium(!): 132 [CA]  2134 Glucose(!): 110 [CA]  2146 TTSMD [CC]  2148 CCS MD [facility] [CC]    Clinical Course User Index [CA] 2149, PA-C [CC] Mannie Stabile, MD    Procedures .Critical Care  Performed by: Glyn Ade, MD Authorized by: Glyn Ade, MD   Critical care provider statement:    Critical care time (minutes):  30   Critical care was necessary to treat or prevent imminent or life-threatening deterioration of the following conditions:  Sepsis   Critical care was time spent personally by me on the following activities:  Development of treatment plan with patient or surrogate, discussions with  consultants, evaluation of patient's response to treatment, examination of patient, ordering and review of laboratory studies, ordering and review of radiographic studies, ordering and performing treatments and interventions, pulse oximetry, re-evaluation of patient's condition and review of old charts    Medications Ordered in ED Medications  gabapentin (NEURONTIN) capsule 300 mg (300 mg Oral Given 12/05/21 2209)  lactated ringers bolus 1,000 mL (has no administration in time range)  metroNIDAZOLE (FLAGYL) IVPB 500 mg (has no administration in time range)  amitriptyline (ELAVIL) tablet 50 mg (has no administration in time range)  0.9 %  sodium chloride infusion ( Intravenous New Bag/Given  12/05/21 2208)  ondansetron (ZOFRAN) injection 4 mg (has no administration in time range)  cefTRIAXone (ROCEPHIN) 2 g in sodium chloride 0.9 % 100 mL IVPB (has no administration in time range)  iohexol (OMNIPAQUE) 300 MG/ML solution 100 mL (100 mLs Intravenous Contrast Given 12/05/21 2101)  morphine (PF) 4 MG/ML injection 4 mg (4 mg Intravenous Given 12/05/21 2212)   Medical Decision Making:    Jonathan Hopkins is a 36 y.o. male who presented to the ED today with abdominal pain, detailed above.    Patient's presentation is complicated by their history of multiple comorbid medical problems.  Patient placed on continuous vitals and telemetry monitoring while in ED which was reviewed periodically.  Complete initial physical exam performed, notably the patient  was hemodynamically stable in no acute distress.     Reviewed and confirmed nursing documentation for past medical history, family history, social history.    Initial Assessment:   With the patient's presentation of abdominal pain, most likely diagnosis is nonspecific etiology. Other diagnoses were considered including (but not limited to) gastroenteritis, colitis, small bowel obstruction, appendicitis, cholecystitis, pancreatitis, nephrolithiasis, UTI, pyleonephritis, testicular torsion. These are considered less likely due to history of present illness and physical exam findings.   This is most consistent with an acute life/limb threatening illness complicated by underlying chronic conditions.   Initial Plan:   CBC/CMP to evaluate for underlying infectious/metabolic etiology for patient's abdominal pain  Lipase to evaluate for pancreatitis  EKG to evaluate for cardiac source of pain  CTAB/Pelvis with contrast to evaluate for structural/surgical etiology of patients' severe abdominal pain.  Urinalysis and repeat physical assessment to evaluate for UTI/Pyelonpehritis  Empiric management of symptoms with escalating pain control and antiemetics  as needed.   Initial Study Results:    Laboratory  All laboratory results reviewed without evidence of clinically relevant pathology.   Exceptions include: Leukocytosis   EKG EKG was reviewed independently. Rate, rhythm, axis, intervals all examined and without medically relevant abnormality. ST segments without concerns for elevations.    Radiology All images reviewed independently. Agree with radiology report at this time.   CT ABDOMEN PELVIS W CONTRAST  Result Date: 12/05/2021 CLINICAL DATA:  RLQ abdominal pain (Age >= 14y). 36 y/o male. RLQ abd pain with nausea that started yesterday, unknown fever at home States diarrhea as well, decreased appetite EXAM: CT ABDOMEN AND PELVIS WITH CONTRAST TECHNIQUE: Multidetector CT imaging of the abdomen and pelvis was performed using the standard protocol following bolus administration of intravenous contrast. RADIATION DOSE REDUCTION: This exam was performed according to the departmental dose-optimization program which includes automated exposure control, adjustment of the mA and/or kV according to patient size and/or use of iterative reconstruction technique. CONTRAST:  OMNIPAQUE IOHEXOL 300 MG/ML  SOLN COMPARISON:  None Available. FINDINGS: Lower chest: Right lower lobe subsegmental atelectasis. No acute abnormality. Hepatobiliary: Mildly enlarged hepatic  parenchyma. No focal liver abnormality. Calcified gallstone noted within the gallbladder lumen. No gallbladder wall thickening or pericholecystic fluid. No biliary dilatation. Pancreas: No focal lesion. Normal pancreatic contour. No surrounding inflammatory changes. No main pancreatic ductal dilatation. Spleen: Normal in size without focal abnormality. Adrenals/Urinary Tract: No adrenal nodule bilaterally. Bilateral kidneys enhance symmetrically. Enlarged right kidney compared to the left. No hydronephrosis. No hydroureter. The urinary bladder is unremarkable. Stomach/Bowel: Stomach is within normal  limits. No evidence of bowel wall thickening or dilatation. Enlarged appendiceal caliber measuring up to 1.3 cm with associated periappendiceal fat stranding and free fluid. Several appendicoliths are noted within the appendiceal lumen. No definite appendiceal wall discontinuity. Vascular/Lymphatic: No abdominal aorta or iliac aneurysm. Mild atherosclerotic plaque of the aorta and its branches. No abdominal, pelvic, or inguinal lymphadenopathy. Reproductive: Prostate is unremarkable. Other: Trace right lower quadrant free fluid. No intraperitoneal free gas. No organized fluid collection. Musculoskeletal: Small fat containing umbilical hernia. No suspicious lytic or blastic osseous lesions. No acute displaced fracture. Multilevel degenerative changes of the spine. IMPRESSION: 1. Non-perforated acute appendicitis with associated appendicoliths. 2. Cholelithiasis no CT finding of acute cholecystitis. 3. Mild hepatomegaly. Electronically Signed   By: Tish Frederickson M.D.   On: 12/05/2021 21:31      Consults: Case discussed with general surgery.      Final Reassessment and Plan:   This is a patient with sepsis and appendicitis concomitantly.  He was emergently treated with IV fluids for resuscitation, IV antibiotics and was emergently arranged for transfer for surgical intervention.  He required frequent reassessment for pain and nausea control prior to being arranged for transfer to main campus.  Patient ultimately transferred for surgical intervention with no further acute events.  Clinical Impression:  1. Acute appendicitis, unspecified acute appendicitis type      Admit   Final Clinical Impression(s) / ED Diagnoses Final diagnoses:  Acute appendicitis, unspecified acute appendicitis type    Rx / DC Orders ED Discharge Orders     None         Glyn Ade, MD 12/06/21 0005

## 2021-12-05 NOTE — ED Notes (Signed)
Patient reports some nausea . Rt abdominal  hurting. Alert x4. 1 loose stool today

## 2021-12-05 NOTE — ED Triage Notes (Signed)
RLQ abd pain with nausea that started yesterday, unknown fever at home  States diarrhea as well, decreased appetite

## 2021-12-06 ENCOUNTER — Inpatient Hospital Stay (HOSPITAL_COMMUNITY): Payer: Medicaid Other | Admitting: Anesthesiology

## 2021-12-06 ENCOUNTER — Other Ambulatory Visit: Payer: Self-pay

## 2021-12-06 ENCOUNTER — Encounter (HOSPITAL_COMMUNITY)
Admission: EM | Disposition: A | Discharge status: Home / Self Care | Payer: Self-pay | Source: Home / Self Care | Attending: Emergency Medicine

## 2021-12-06 ENCOUNTER — Encounter (HOSPITAL_COMMUNITY): Payer: Self-pay

## 2021-12-06 DIAGNOSIS — K3580 Unspecified acute appendicitis: Secondary | ICD-10-CM | POA: Diagnosis not present

## 2021-12-06 HISTORY — PX: LAPAROSCOPIC APPENDECTOMY: SHX408

## 2021-12-06 SURGERY — APPENDECTOMY, LAPAROSCOPIC
Anesthesia: General

## 2021-12-06 MED ORDER — MIDAZOLAM HCL 5 MG/5ML IJ SOLN
INTRAMUSCULAR | Status: DC | PRN
  Administered 2021-12-06: 2 mg via INTRAVENOUS

## 2021-12-06 MED ORDER — FENTANYL CITRATE PF 50 MCG/ML IJ SOSY
PREFILLED_SYRINGE | INTRAMUSCULAR | Status: AC
  Filled 2021-12-06: qty 1

## 2021-12-06 MED ORDER — ROCURONIUM BROMIDE 10 MG/ML (PF) SYRINGE
PREFILLED_SYRINGE | INTRAVENOUS | Status: DC | PRN
  Administered 2021-12-06: 50 mg via INTRAVENOUS

## 2021-12-06 MED ORDER — PHENYLEPHRINE 80 MCG/ML (10ML) SYRINGE FOR IV PUSH (FOR BLOOD PRESSURE SUPPORT)
PREFILLED_SYRINGE | INTRAVENOUS | Status: DC | PRN
  Administered 2021-12-06 (×4): 120 ug via INTRAVENOUS

## 2021-12-06 MED ORDER — LACTATED RINGERS IR SOLN
Status: DC | PRN
  Administered 2021-12-06: 1000 mL

## 2021-12-06 MED ORDER — LIDOCAINE 2% (20 MG/ML) 5 ML SYRINGE
INTRAMUSCULAR | Status: DC | PRN
  Administered 2021-12-06: 100 mg via INTRAVENOUS

## 2021-12-06 MED ORDER — ONDANSETRON 4 MG PO TBDP: 4.0000 mg | ORAL_TABLET | Freq: Four times a day (QID) | ORAL | Status: DC | PRN

## 2021-12-06 MED ORDER — OXYCODONE HCL 5 MG/5ML PO SOLN: 5.0000 mg | Freq: Once | ORAL | Status: DC | PRN

## 2021-12-06 MED ORDER — SIMETHICONE 80 MG PO CHEW: 40.0000 mg | CHEWABLE_TABLET | Freq: Four times a day (QID) | ORAL | Status: DC | PRN

## 2021-12-06 MED ORDER — OXYCODONE HCL 5 MG PO TABS: 5.0000 mg | ORAL_TABLET | Freq: Once | ORAL | Status: DC | PRN

## 2021-12-06 MED ORDER — METRONIDAZOLE 500 MG/100ML IV SOLN
500.0000 mg | Freq: Two times a day (BID) | INTRAVENOUS | Status: DC
  Administered 2021-12-06 – 2021-12-07 (×2): 500 mg via INTRAVENOUS
  Filled 2021-12-06 (×2): qty 100

## 2021-12-06 MED ORDER — FENTANYL CITRATE PF 50 MCG/ML IJ SOSY
25.0000 ug | PREFILLED_SYRINGE | INTRAMUSCULAR | Status: DC | PRN
  Administered 2021-12-06: 50 ug via INTRAVENOUS

## 2021-12-06 MED ORDER — PROPOFOL 10 MG/ML IV BOLUS
INTRAVENOUS | Status: AC
Start: 2021-12-06 — End: ?
  Filled 2021-12-06: qty 20

## 2021-12-06 MED ORDER — LACTATED RINGERS IV SOLN: INTRAVENOUS | Status: DC

## 2021-12-06 MED ORDER — FENTANYL CITRATE PF 50 MCG/ML IJ SOSY
50.0000 ug | PREFILLED_SYRINGE | Freq: Once | INTRAMUSCULAR | Status: AC
  Administered 2021-12-06: 50 ug via INTRAVENOUS
  Filled 2021-12-06: qty 1

## 2021-12-06 MED ORDER — OXYCODONE HCL 5 MG PO TABS
5.0000 mg | ORAL_TABLET | ORAL | Status: DC | PRN
  Administered 2021-12-06: 5 mg via ORAL
  Administered 2021-12-06 – 2021-12-07 (×2): 10 mg via ORAL
  Filled 2021-12-06 (×3): qty 2

## 2021-12-06 MED ORDER — MIDAZOLAM HCL 2 MG/2ML IJ SOLN
INTRAMUSCULAR | Status: AC
  Filled 2021-12-06: qty 2

## 2021-12-06 MED ORDER — SODIUM CHLORIDE 0.9 % IV SOLN: 2.0000 g | INTRAVENOUS | Status: DC

## 2021-12-06 MED ORDER — ROCURONIUM BROMIDE 10 MG/ML (PF) SYRINGE
PREFILLED_SYRINGE | INTRAVENOUS | Status: AC
  Filled 2021-12-06: qty 10

## 2021-12-06 MED ORDER — 0.9 % SODIUM CHLORIDE (POUR BTL) OPTIME
TOPICAL | Status: DC | PRN
  Administered 2021-12-06: 1000 mL

## 2021-12-06 MED ORDER — ONDANSETRON HCL 4 MG/2ML IJ SOLN
4.0000 mg | Freq: Four times a day (QID) | INTRAMUSCULAR | Status: DC | PRN
  Administered 2021-12-06 (×2): 4 mg via INTRAVENOUS
  Filled 2021-12-06: qty 2

## 2021-12-06 MED ORDER — SUGAMMADEX SODIUM 200 MG/2ML IV SOLN
INTRAVENOUS | Status: DC | PRN
  Administered 2021-12-06: 200 mg via INTRAVENOUS

## 2021-12-06 MED ORDER — FENTANYL CITRATE (PF) 250 MCG/5ML IJ SOLN
INTRAMUSCULAR | Status: AC
  Filled 2021-12-06: qty 5

## 2021-12-06 MED ORDER — OXYBUTYNIN CHLORIDE ER 5 MG PO TB24
5.0000 mg | ORAL_TABLET | Freq: Once | ORAL | Status: AC
  Administered 2021-12-06: 5 mg via ORAL
  Filled 2021-12-06: qty 1

## 2021-12-06 MED ORDER — HYDROMORPHONE HCL 1 MG/ML IJ SOLN
1.0000 mg | INTRAMUSCULAR | Status: DC | PRN
  Administered 2021-12-06 (×3): 1 mg via INTRAVENOUS
  Filled 2021-12-06 (×3): qty 1

## 2021-12-06 MED ORDER — SODIUM CHLORIDE 0.9 % IV SOLN: INTRAVENOUS | Status: DC

## 2021-12-06 MED ORDER — FENTANYL CITRATE (PF) 100 MCG/2ML IJ SOLN
INTRAMUSCULAR | Status: DC | PRN
Start: 2021-12-06 — End: 2021-12-06
  Administered 2021-12-06 (×2): 50 ug via INTRAVENOUS

## 2021-12-06 MED ORDER — ACETAMINOPHEN 500 MG PO TABS
1000.0000 mg | ORAL_TABLET | Freq: Four times a day (QID) | ORAL | Status: DC
  Administered 2021-12-06 – 2021-12-07 (×5): 1000 mg via ORAL
  Filled 2021-12-06 (×5): qty 2

## 2021-12-06 MED ORDER — ENOXAPARIN SODIUM 40 MG/0.4ML IJ SOSY
40.0000 mg | PREFILLED_SYRINGE | INTRAMUSCULAR | Status: DC
  Administered 2021-12-07: 40 mg via SUBCUTANEOUS
  Filled 2021-12-06: qty 0.4

## 2021-12-06 MED ORDER — BUPIVACAINE-EPINEPHRINE (PF) 0.25% -1:200000 IJ SOLN
INTRAMUSCULAR | Status: AC
  Filled 2021-12-06: qty 30

## 2021-12-06 MED ORDER — DEXAMETHASONE SODIUM PHOSPHATE 10 MG/ML IJ SOLN
INTRAMUSCULAR | Status: DC | PRN
  Administered 2021-12-06: 4 mg via INTRAVENOUS

## 2021-12-06 MED ORDER — ESMOLOL HCL 100 MG/10ML IV SOLN
INTRAVENOUS | Status: DC | PRN
  Administered 2021-12-06: 20 mg via INTRAVENOUS
  Administered 2021-12-06: 30 mg via INTRAVENOUS
  Administered 2021-12-06: 20 mg via INTRAVENOUS
  Administered 2021-12-06: 10 mg via INTRAVENOUS
  Administered 2021-12-06: 20 mg via INTRAVENOUS

## 2021-12-06 MED ORDER — BUPIVACAINE-EPINEPHRINE 0.25% -1:200000 IJ SOLN
INTRAMUSCULAR | Status: DC | PRN
  Administered 2021-12-06: 30 mL

## 2021-12-06 MED ORDER — PROPOFOL 10 MG/ML IV BOLUS
INTRAVENOUS | Status: DC | PRN
  Administered 2021-12-06: 150 mg via INTRAVENOUS

## 2021-12-06 MED ORDER — CHLORHEXIDINE GLUCONATE 0.12 % MT SOLN
15.0000 mL | Freq: Once | OROMUCOSAL | Status: AC
  Administered 2021-12-06: 15 mL via OROMUCOSAL

## 2021-12-06 MED ORDER — ENOXAPARIN SODIUM 40 MG/0.4ML IJ SOSY: 40.0000 mg | PREFILLED_SYRINGE | INTRAMUSCULAR | Status: DC

## 2021-12-06 MED ORDER — LIDOCAINE HCL (PF) 2 % IJ SOLN
INTRAMUSCULAR | Status: AC
  Filled 2021-12-06: qty 5

## 2021-12-06 SURGICAL SUPPLY — 40 items
APPLIER CLIP ROT 10 11.4 M/L (STAPLE)
BAG COUNTER SPONGE SURGICOUNT (BAG) IMPLANT
CHLORAPREP W/TINT 26 (MISCELLANEOUS) ×1 IMPLANT
CLIP APPLIE ROT 10 11.4 M/L (STAPLE) IMPLANT
COVER SURGICAL LIGHT HANDLE (MISCELLANEOUS) ×1 IMPLANT
CUTTER FLEX LINEAR 45M (STAPLE) IMPLANT
DERMABOND ADVANCED (GAUZE/BANDAGES/DRESSINGS) ×1
DERMABOND ADVANCED .7 DNX12 (GAUZE/BANDAGES/DRESSINGS) ×1 IMPLANT
ELECT PENCIL ROCKER SW 15FT (MISCELLANEOUS) IMPLANT
ELECT REM PT RETURN 15FT ADLT (MISCELLANEOUS) ×1 IMPLANT
ENDOLOOP SUT PDS II  0 18 (SUTURE)
ENDOLOOP SUT PDS II 0 18 (SUTURE) IMPLANT
GLOVE SURG ORTHO 8.0 STRL STRW (GLOVE) ×1 IMPLANT
GLOVE SURG SYN 7.5  E (GLOVE) ×1
GLOVE SURG SYN 7.5 E (GLOVE) ×1 IMPLANT
GLOVE SURG SYN 7.5 PF PI (GLOVE) ×1 IMPLANT
GOWN STRL REUS W/ TWL XL LVL3 (GOWN DISPOSABLE) ×2 IMPLANT
GOWN STRL REUS W/TWL XL LVL3 (GOWN DISPOSABLE) ×2
IRRIG SUCT STRYKERFLOW 2 WTIP (MISCELLANEOUS) ×1
IRRIGATION SUCT STRKRFLW 2 WTP (MISCELLANEOUS) ×1 IMPLANT
KIT BASIN OR (CUSTOM PROCEDURE TRAY) ×1 IMPLANT
KIT TURNOVER KIT A (KITS) IMPLANT
RELOAD 45 VASCULAR/THIN (ENDOMECHANICALS) IMPLANT
RELOAD STAPLE 45 2.5 WHT GRN (ENDOMECHANICALS) IMPLANT
RELOAD STAPLE 45 3.5 BLU ETS (ENDOMECHANICALS) IMPLANT
RELOAD STAPLE TA45 3.5 REG BLU (ENDOMECHANICALS) ×1 IMPLANT
SET TUBE SMOKE EVAC HIGH FLOW (TUBING) ×1 IMPLANT
SHEARS HARMONIC ACE PLUS 36CM (ENDOMECHANICALS) ×1 IMPLANT
SPIKE FLUID TRANSFER (MISCELLANEOUS) ×1 IMPLANT
SUT MNCRL AB 4-0 PS2 18 (SUTURE) ×1 IMPLANT
SYS BAG RETRIEVAL 10MM (BASKET) ×1
SYSTEM BAG RETRIEVAL 10MM (BASKET) ×1 IMPLANT
TOWEL OR 17X26 10 PK STRL BLUE (TOWEL DISPOSABLE) ×1 IMPLANT
TOWEL OR NON WOVEN STRL DISP B (DISPOSABLE) ×1 IMPLANT
TRAY FOLEY MTR SLVR 14FR STAT (SET/KITS/TRAYS/PACK) IMPLANT
TRAY FOLEY MTR SLVR 16FR STAT (SET/KITS/TRAYS/PACK) IMPLANT
TRAY LAPAROSCOPIC (CUSTOM PROCEDURE TRAY) ×1 IMPLANT
TROCAR 11X100 Z THREAD (TROCAR) ×1 IMPLANT
TROCAR BALLN 12MMX100 BLUNT (TROCAR) ×1 IMPLANT
TROCAR Z-THREAD OPTICAL 5X100M (TROCAR) ×1 IMPLANT

## 2021-12-06 NOTE — ED Provider Notes (Incomplete)
Jonathan Hopkins   CSN: [id number] Arrival date & time: 12/05/21  1954     History Chief Complaint  Patient presents with  . Abdominal Pain    HPI Jonathan Hopkins is a 36 y.o. male presenting for abdominal pain.  He has an extensive medical history including a history of spinal cerebellar atrophy.  He has focal right lower quadrant pain that started 4 hours prior to arrival, no known sick contacts.  No history of similar. Patient's recorded medical, surgical, social, medication list and allergies were reviewed in the Snapshot window as part of the initial history.   Review of Systems   Review of Systems  Constitutional:  Negative for chills and fever.  HENT:  Negative for ear pain and sore throat.   Eyes:  Negative for pain and visual disturbance.  Respiratory:  Negative for cough and shortness of breath.   Cardiovascular:  Negative for chest pain and palpitations.  Gastrointestinal:  Positive for abdominal pain and nausea. Negative for vomiting.  Genitourinary:  Negative for dysuria and hematuria.  Musculoskeletal:  Negative for arthralgias and back pain.  Skin:  Negative for color change and rash.  Neurological:  Negative for seizures and syncope.  All other systems reviewed and are negative.   Physical Exam Updated Vital Signs BP 122/76   Pulse (!) 118   Temp 98.6 F (37 C) (Oral)   Resp 18   Ht 6\' 1"  (1.854 m)   Wt 86.5 kg   SpO2 98%   BMI 25.16 kg/m  Physical Exam Vitals and nursing Hopkins reviewed.  Constitutional:      General: He is not in acute distress.    Appearance: He is well-developed.  HENT:     Head: Normocephalic and atraumatic.  Eyes:     Conjunctiva/sclera: Conjunctivae normal.  Cardiovascular:     Rate and Rhythm: Normal rate and regular rhythm.     Heart sounds: No murmur heard. Pulmonary:     Effort: Pulmonary effort is normal. No respiratory distress.     Breath sounds: Normal breath sounds.   Abdominal:     Palpations: Abdomen is soft.     Tenderness: There is abdominal tenderness in the right lower quadrant and periumbilical area.  Musculoskeletal:        General: No swelling.     Cervical back: Neck supple.  Skin:    General: Skin is warm and dry.     Capillary Refill: Capillary refill takes less than 2 seconds.  Neurological:     Mental Status: He is alert.  Psychiatric:        Mood and Affect: Mood normal.      ED Course/ Medical Decision Making/ A&P Clinical Course as of 12/05/21 2148  Wed Dec 05, 2021  2134 WBC(!): 25.2 [CA]  2134 Sodium(!): 132 [CA]  2134 Glucose(!): 110 [CA]  2146 [facility] [CC]  2148 [facility] [CC]    Clinical Course User Index [CA] Suzy Bouchard, PA-C [CC] Tretha Sciara, MD    Procedures .Critical Care  Performed by: Tretha Sciara, MD Authorized by: Tretha Sciara, MD   Critical care provider statement:    Critical care time (minutes):  30   Critical care was necessary to treat or prevent imminent or life-threatening deterioration of the following conditions:  Sepsis   Critical care was time spent personally by me on the following activities:  Development of treatment plan with patient or surrogate, discussions with consultants, evaluation of  patient's response to treatment, examination of patient, ordering and review of laboratory studies, ordering and review of radiographic studies, ordering and performing treatments and interventions, pulse oximetry, re-evaluation of patient's condition and review of old charts    Medications Ordered in ED Medications  iohexol (OMNIPAQUE) 300 MG/ML solution 100 mL (100 mLs Intravenous Contrast Given 12/05/21 2101)   Medical Decision Making:    Jonathan Hopkins is a 36 y.o. male who presented to the ED today with abdominal pain, detailed above.    Patient's presentation is complicated by their history of multiple comorbid medical problems.  Patient placed on continuous vitals  and telemetry monitoring while in ED which was reviewed periodically.  Complete initial physical exam performed, notably the patient  was hemodynamically stable in no acute distress.     Reviewed and confirmed nursing documentation for past medical history, family history, social history.    Initial Assessment:   With the patient's presentation of abdominal pain, most likely diagnosis is nonspecific etiology. Other diagnoses were considered including (but not limited to) gastroenteritis, colitis, small bowel obstruction, appendicitis, cholecystitis, pancreatitis, nephrolithiasis, UTI, pyleonephritis, testicular torsion. These are considered less likely due to history of present illness and physical exam findings.   This is most consistent with an acute life/limb threatening illness complicated by underlying chronic conditions.   Initial Plan:   CBC/CMP to evaluate for underlying infectious/metabolic etiology for patient's abdominal pain  Lipase to evaluate for pancreatitis  EKG to evaluate for cardiac source of pain  CTAB/Pelvis with contrast to evaluate for structural/surgical etiology of patients' severe abdominal pain.  Urinalysis and repeat physical assessment to evaluate for UTI/Pyelonpehritis  Empiric management of symptoms with escalating pain control and antiemetics as needed.   Initial Study Results:    Laboratory  All laboratory results reviewed without evidence of clinically relevant pathology.   Exceptions include: Leukocytosis   EKG EKG was reviewed independently. Rate, rhythm, axis, intervals all examined and without medically relevant abnormality. ST segments without concerns for elevations.    Radiology All images reviewed independently. Agree with radiology report at this time.   CT ABDOMEN PELVIS W CONTRAST  Result Date: 12/05/2021 CLINICAL DATA:  RLQ abdominal pain (Age >= 14y). 36 y/o male. RLQ abd pain with nausea that started yesterday, unknown fever at home States  diarrhea as well, decreased appetite EXAM: CT ABDOMEN AND PELVIS WITH CONTRAST TECHNIQUE: Multidetector CT imaging of the abdomen and pelvis was performed using the standard protocol following bolus administration of intravenous contrast. RADIATION DOSE REDUCTION: This exam was performed according to the departmental dose-optimization program which includes automated exposure control, adjustment of the mA and/or kV according to patient size and/or use of iterative reconstruction technique. CONTRAST:  OMNIPAQUE IOHEXOL 300 MG/ML  SOLN COMPARISON:  None Available. FINDINGS: Lower chest: Right lower lobe subsegmental atelectasis. No acute abnormality. Hepatobiliary: Mildly enlarged hepatic parenchyma. No focal liver abnormality. Calcified gallstone noted within the gallbladder lumen. No gallbladder wall thickening or pericholecystic fluid. No biliary dilatation. Pancreas: No focal lesion. Normal pancreatic contour. No surrounding inflammatory changes. No main pancreatic ductal dilatation. Spleen: Normal in size without focal abnormality. Adrenals/Urinary Tract: No adrenal nodule bilaterally. Bilateral kidneys enhance symmetrically. Enlarged right kidney compared to the left. No hydronephrosis. No hydroureter. The urinary bladder is unremarkable. Stomach/Bowel: Stomach is within normal limits. No evidence of bowel wall thickening or dilatation. Enlarged appendiceal caliber measuring up to 1.3 cm with associated periappendiceal fat stranding and free fluid. Several appendicoliths are noted within the appendiceal lumen.  No definite appendiceal wall discontinuity. Vascular/Lymphatic: No abdominal aorta or iliac aneurysm. Mild atherosclerotic plaque of the aorta and its branches. No abdominal, pelvic, or inguinal lymphadenopathy. Reproductive: Prostate is unremarkable. Other: Trace right lower quadrant free fluid. No intraperitoneal free gas. No organized fluid collection. Musculoskeletal: Small fat containing  umbilical hernia. No suspicious lytic or blastic osseous lesions. No acute displaced fracture. Multilevel degenerative changes of the spine. IMPRESSION: 1. Non-perforated acute appendicitis with associated appendicoliths. 2. Cholelithiasis no CT finding of acute cholecystitis. 3. Mild hepatomegaly. Electronically Signed   By: Tish Frederickson M.D.   On: 12/05/2021 21:31      Consults: Case discussed with general surgery.      Final Reassessment and Plan:   This is a patient with sepsis and appendicitis concomitantly.  He was emergently treated with IV fluids for resuscitation, IV antibiotics and was emergently arranged for transfer for surgical intervention.  He required frequent reassessment for pain and nausea control prior to being arranged for transfer to main campus.  Patient ultimately transferred for surgical intervention with no further acute events.  Clinical Impression: No diagnosis found.   Data Unavailable   Final Clinical Impression(s) / ED Diagnoses Final diagnoses:  None    Rx / DC Orders ED Discharge Orders     None

## 2021-12-06 NOTE — Op Note (Signed)
      OPERATIVE REPORT - LAPAROSCOPIC APPENDECTOMY  Preop diagnosis:  Acute gangrenous appendicitis  Postop diagnosis:  same  Procedure:  Laparoscopic appendectomy  Surgeon:  [name], MD  Anesthesia:  general endotracheal  Estimated blood loss:  minimal  Preparation:  Chlora-prep  Complications:  none  Indications: Patient is a 36 year old male who presented to med [facility] with signs and symptoms of acute appendicitis.  CT scan confirmed acute appendicitis with appendicoliths.  Patient was transferred to [facility] for surgical management.  Patient was prepared and now comes to the operating room for appendectomy.  Procedure:  Patient was brought to the operating room and placed in a supine position on the operating room table. Following administration of general anesthesia, a time out was held and the patient's name and procedure was confirmed. Patient was then prepped and draped in the usual strict aseptic fashion.  After ascertaining that an adequate level of anesthesia had been achieved, a peri-umbilical incision was made with a #15 blade. Dissection was carried down to the fascia. Fascia was incised in the midline and the peritoneal cavity was entered cautiously. A #0-vicryl pursestring suture was placed in the fascia. An Hassan cannula was introduced under direct vision and secured with the pursestring suture. The abdomen was insufflated with carbon dioxide. The laparoscope was introduced and the abdomen was explored. Operative ports were placed in the right upper quadrant and left lower quadrant.  There was a significant inflammatory process in the right lower quadrant.  Loops of small intestine were gently mobilized with blunt dissection off of the inflammatory mass.  The appendix was identified.  It was acutely inflamed and partially gangrenous.  There was no sign of gross perforation.  Appendix was gently mobilized.  Mesoappendix was divided with the  harmonic scalpel.  Dissection was carried down to the base of the appendix.  An Endo GIA stapler was used to transect the base of the appendix at its junction with the cecal wall.  Staple line appeared to achieve good closure and was hemostatic.  The appendix was placed into an endo-catch bag and withdrawn through the umbilical port. The #0-vicryl pursestring suture was tied securely.  Right lower quadrant was irrigated with warm saline which was evacuated. Good hemostasis was noted. Ports were removed under direct vision. Good hemostasis was noted at the port sites. Pneumoperitoneum was released.  Skin incisions were anesthetized with local anesthetic. Wounds were closed with interrupted 4-0 Monocryl subcuticular sutures. Wounds were washed and dried and Dermabond was applied. The patient was awakened from anesthesia and brought to the recovery room. The patient tolerated the procedure well.  [name], MD [facility] Surgery Office: [phone]

## 2021-12-06 NOTE — Discharge Instructions (Signed)
[facility] ° °Please arrive at least 30 min before your appointment to complete your check in paperwork.  If you are unable to arrive 30 min prior to your appointment time we may have to cancel or reschedule you. °LAPAROSCOPIC SURGERY: POST OP INSTRUCTIONS °Always review your discharge instruction sheet given to you by the facility where your surgery was performed. °IF YOU HAVE DISABILITY OR FAMILY LEAVE FORMS, YOU MUST BRING THEM TO THE OFFICE FOR PROCESSING.   °DO NOT GIVE THEM TO YOUR DOCTOR. ° °PAIN CONTROL ° °First take acetaminophen (Tylenol) AND/or ibuprofen (Advil) to control your pain after surgery.  Follow directions on package.  Taking acetaminophen (Tylenol) and/or ibuprofen (Advil) regularly after surgery will help to control your pain and lower the amount of prescription pain medication you may need.  You should not take more than 4,000 mg (4 grams) of acetaminophen (Tylenol) in 24 hours.  You should not take ibuprofen (Advil), aleve, motrin, naprosyn or other NSAIDS if you have a history of stomach ulcers or chronic kidney disease.  °A prescription for pain medication may be given to you upon discharge.  Take your pain medication as prescribed, if you still have uncontrolled pain after taking acetaminophen (Tylenol) or ibuprofen (Advil). °Use ice packs to help control pain. °If you need a refill on your pain medication, please contact your pharmacy.  They will contact our office to request authorization. Prescriptions will not be filled after 5pm or on week-ends. ° °HOME MEDICATIONS °Take your usually prescribed medications unless otherwise directed. ° °DIET °You should follow a light diet the first few days after arrival home.  Be sure to include lots of fluids daily. Avoid fatty, fried foods.  ° °CONSTIPATION °It is common to experience some constipation after surgery and if you are taking pain medication.  Increasing fluid intake and taking a stool softener (such as Colace)  will usually help or prevent this problem from occurring.  A mild laxative (Milk of Magnesia or Miralax) should be taken according to package instructions if there are no bowel movements after 48 hours. ° °WOUND/INCISION CARE °Most patients will experience some swelling and bruising in the area of the incisions.  Ice packs will help.  Swelling and bruising can take several days to resolve.  °Unless discharge instructions indicate otherwise, follow guidelines below  °STERI-STRIPS - you may remove your outer bandages 48 hours after surgery, and you may shower at that time.  You have steri-strips (small skin tapes) in place directly over the incision.  These strips should be left on the skin for 7-10 days.   °DERMABOND/SKIN GLUE - you may shower in 24 hours.  The glue will flake off over the next 2-3 weeks. °Any sutures or staples will be removed at the office during your follow-up visit. ° °ACTIVITIES °You may resume regular (light) daily activities beginning the next day--such as daily self-care, walking, climbing stairs--gradually increasing activities as tolerated.  You may have sexual intercourse when it is comfortable.  Refrain from any heavy lifting or straining until approved by your doctor. °You may drive when you are no longer taking prescription pain medication, you can comfortably wear a seatbelt, and you can safely maneuver your car and apply brakes. ° °FOLLOW-UP °You should see your doctor in the office for a follow-up appointment approximately 2-3 weeks after your surgery.  You should have been given your post-op/follow-up appointment when your surgery was scheduled.  If you did not receive a post-op/follow-up appointment, make sure   that you call for this appointment within a day or two after you arrive home to insure a convenient appointment time. ° ° °WHEN TO CALL YOUR DOCTOR: °Fever over 101.0 °Inability to urinate °Continued bleeding from incision. °Increased pain, redness, or drainage from the  incision. °Increasing abdominal pain ° °The clinic staff is available to answer your questions during regular business hours.  Please don’t hesitate to call and ask to speak to one of the nurses for clinical concerns.  If you have a medical emergency, go to the nearest emergency room or call 911.  A surgeon from [facility] is always on call at the hospital. °[address], [address], [place], [place]  [address] ? [address] [address], [place],    [address] °[phone] ? [phone] ? FAX [phone] ° ° ° ° °Managing Your Pain After Surgery Without Opioids ° ° ° °Thank you for participating in our program to help patients manage their pain after surgery without opioids. This is part of our effort to provide you with the best care possible, without exposing you or your family to the risk that opioids pose. ° °What pain can I expect after surgery? °You can expect to have some pain after surgery. This is normal. The pain is typically worse the day after surgery, and quickly begins to get better. °Many studies have found that many patients are able to manage their pain after surgery with Over-the-Counter (OTC) medications such as Tylenol and Motrin. If you have a condition that does not allow you to take Tylenol or Motrin, notify your surgical team. ° °How will I manage my pain? °The best strategy for controlling your pain after surgery is around the clock pain control with Tylenol (acetaminophen) and Motrin (ibuprofen or Advil). Alternating these medications with each other allows you to maximize your pain control. In addition to Tylenol and Motrin, you can use heating pads or ice packs on your incisions to help reduce your pain. ° °How will I alternate your regular strength over-the-counter pain medication? °You will take a dose of pain medication every three hours. °Start by taking 650 mg of Tylenol (2 pills of 325 mg) °3 hours later take 600 mg of Motrin (3 pills of 200 mg) °3 hours after  taking the Motrin take 650 mg of Tylenol °3 hours after that take 600 mg of Motrin. ° ° °- 1 - ° °See example - if your first dose of Tylenol is at 12:00 PM ° ° °12:00 PM Tylenol 650 mg (2 pills of 325 mg)  °3:00 PM Motrin 600 mg (3 pills of 200 mg)  °6:00 PM Tylenol 650 mg (2 pills of 325 mg)  °9:00 PM Motrin 600 mg (3 pills of 200 mg)  °Continue alternating every 3 hours  ° °We recommend that you follow this schedule around-the-clock for at least 3 days after surgery, or until you feel that it is no longer needed. Use the table on the last page of this handout to keep track of the medications you are taking. °Important: °Do not take more than 3000mg of Tylenol or 3200mg of Motrin in a 24-hour period. °Do not take ibuprofen/Motrin if you have a history of bleeding stomach ulcers, severe kidney disease, &/or actively taking a blood thinner ° °What if I still have pain? °If you have pain that is not controlled with the over-the-counter pain medications (Tylenol and Motrin or Advil) you might have what we call “breakthrough” pain. You will receive a prescription   for a small amount of an opioid pain medication such as Oxycodone, Tramadol, or Tylenol with Codeine. Use these opioid pills in the first 24 hours after surgery if you have breakthrough pain. Do not take more than 1 pill every 4-6 hours. ° °If you still have uncontrolled pain after using all opioid pills, don't hesitate to call our staff using the number provided. We will help make sure you are managing your pain in the best way possible, and if necessary, we can provide a prescription for additional pain medication. ° ° °Day 1   ° °Time  °Name of Medication Number of pills taken  °Amount of Acetaminophen  °Pain Level  ° °Comments  °AM PM       °AM PM       °AM PM       °AM PM       °AM PM       °AM PM       °AM PM       °AM PM       °Total Daily amount of Acetaminophen °Do not take more than  3,000 mg per day    ° ° °Day 2   ° °Time  °Name of Medication  Number of pills °taken  °Amount of Acetaminophen  °Pain Level  ° °Comments  °AM PM       °AM PM       °AM PM       °AM PM       °AM PM       °AM PM       °AM PM       °AM PM       °Total Daily amount of Acetaminophen °Do not take more than  3,000 mg per day    ° ° °Day 3   ° °Time  °Name of Medication Number of pills taken  °Amount of Acetaminophen  °Pain Level  ° °Comments  °AM PM       °AM PM       °AM PM       °AM PM       ° ° ° °AM PM       °AM PM       °AM PM       °AM PM       °Total Daily amount of Acetaminophen °Do not take more than  3,000 mg per day    ° ° °Day 4   ° °Time  °Name of Medication Number of pills taken  °Amount of Acetaminophen  °Pain Level  ° °Comments  °AM PM       °AM PM       °AM PM       °AM PM       °AM PM       °AM PM       °AM PM       °AM PM       °Total Daily amount of Acetaminophen °Do not take more than  3,000 mg per day    ° ° °Day 5   ° °Time  °Name of Medication Number °of pills taken  °Amount of Acetaminophen  °Pain Level  ° °Comments  °AM PM       °AM PM       °AM PM       °AM PM       °AM PM       °AM   PM       °AM PM       °AM PM       °Total Daily amount of Acetaminophen °Do not take more than  3,000 mg per day    ° ° ° °Day 6   ° °Time  °Name of Medication Number of pills °taken  °Amount of Acetaminophen  °Pain Level  °Comments  °AM PM       °AM PM       °AM PM       °AM PM       °AM PM       °AM PM       °AM PM       °AM PM       °Total Daily amount of Acetaminophen °Do not take more than  3,000 mg per day    ° ° °Day 7   ° °Time  °Name of Medication Number of pills taken  °Amount of Acetaminophen  °Pain Level  ° °Comments  °AM PM       °AM PM       °AM PM       °AM PM       °AM PM       °AM PM       °AM PM       °AM PM       °Total Daily amount of Acetaminophen °Do not take more than  3,000 mg per day    ° ° ° ° °For additional information about how and where to safely dispose of unused opioid °medications - https://www.morepowerfulnc.org ° °Disclaimer: This document  contains information and/or instructional materials adapted from [facility] for the typical patient with your condition. It does not replace medical advice from your health care provider because your experience may differ from that of the °typical patient. Talk to your health care provider if you have any questions about this °document, your condition or your treatment plan. °Adapted from [facility] ° °

## 2021-12-06 NOTE — H&P (Signed)
Jonathan Hopkins 02/20/1986  [id number].    Chief Complaint/Reason for Consult: Acute appendicitis  HPI:  This is a 36 year old male with a history of SCA-3 who began having right lower quadrant abdominal pain on Tuesday at 4 PM.  He admitted to nausea but no vomiting.  He did have some diarrhea yesterday.  He admits to a fever here up to 100.1.  Due to persistent right lower quadrant pain that was failing to improve, he presented to med [facility] for further evaluation.  He has been noted to have a white blood cell count of 25,000 and a total bilirubin of 2.1.  He has a CT scan revealing acute uncomplicated appendicitis.  He has been transferred here to [facility] long hospital for further management.  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 His smoking use included cigarettes. He smoked an average of .5 packs per day. He has never used smokeless tobacco. He reports that he does not currently use alcohol. He reports that he does not use drugs.  Allergies:  Allergies  Allergen Reactions   Amoxicillin Swelling    Medications Prior to Admission  Medication Sig Dispense Refill   amitriptyline (ELAVIL) 50 MG tablet Take 50 mg by mouth at bedtime.     amLODipine (NORVASC) 10 MG tablet Take 10 mg by mouth daily.     gabapentin (NEURONTIN) 300 MG capsule Take 1 capsule (300 mg total) by mouth at bedtime. 30 capsule 2   Multiple Vitamins-Minerals (MULTI COMPLETE PO) Take  1 capsule by mouth daily.     Omega-3 Fatty Acids (OMEGA 3 PO) Take 1 capsule by mouth daily.     oxybutynin (DITROPAN-XL) 5 MG 24 hr tablet Take 5 mg by mouth daily.     terbinafine (LAMISIL) 250 MG tablet Take 250 mg by mouth daily.       Physical Exam: Blood pressure 115/78, pulse (!) 102, temperature 98.1 F (36.7 C), resp. rate 16, height 6\' 1"  (1.854 m), weight 86.5 kg, SpO2 93 %. General: pleasant, WD, WN white male who is laying in bed in NAD HEENT: head is normocephalic, atraumatic.  Sclera are noninjected.  PERRL.  Ears and nose without any masses or lesions.  Mouth is pink and moist Heart: regular, rate, and rhythm.  Normal s1,s2. No obvious murmurs, gallops, or rubs noted.  Palpable radial and pedal pulses bilaterally Lungs: CTAB, no wheezes, rhonchi, or rales noted.  Respiratory effort nonlabored Abd: soft, tender in right lower quadrant and McBurney's point, ND, +BS, no masses, hernias, or organomegaly MS: all 4 extremities are symmetrical with no cyanosis, clubbing, or edema. Skin: warm and dry with no masses, lesions, or rashes Neuro: Cranial nerves 2-12 grossly intact, sensation is normal throughout, but some ataxia noted. Psych: A&Ox3 with  an appropriate affect.   Results for orders placed or performed during the hospital encounter of 12/05/21 (from the past 48 hour(s))  Lipase, blood     Status: None   Collection Time: 12/05/21  8:16 PM  Result Value Ref Range   Lipase 43 11 - 51 U/L    Comment: Performed at [facility], [address] [place], [place] [address]  Comprehensive metabolic panel     Status: Abnormal   Collection Time: 12/05/21  8:16 PM  Result Value Ref Range   Sodium 132 (L) 135 - 145 mmol/L   Potassium 3.5 3.5 - 5.1 mmol/L   Chloride 99 98 - 111 mmol/L   CO2 24 22 - 32 mmol/L   Glucose, Bld 110 (H) 70 - 99 mg/dL    Comment: Glucose reference range applies only to samples taken after fasting for at least 8 hours.   BUN 17 6 - 20 mg/dL    Creatinine, Ser 8.33 0.61 - 1.24 mg/dL   Calcium 9.1 8.9 - 82.5 mg/dL   Total Protein 7.6 6.5 - 8.1 g/dL   Albumin 4.3 3.5 - 5.0 g/dL   AST 20 15 - 41 U/L   ALT 18 0 - 44 U/L   Alkaline Phosphatase 91 38 - 126 U/L   Total Bilirubin 2.1 (H) 0.3 - 1.2 mg/dL   GFR, Estimated >05 >39 mL/min    Comment: (NOTE) Calculated using the CKD-EPI Creatinine Equation (2021)    Anion gap 9 5 - 15    Comment: Performed at [facility], [address] [place], [place] [address]  CBC     Status: Abnormal   Collection Time: 12/05/21  8:16 PM  Result Value Ref Range   WBC 25.2 (H) 4.0 - 10.5 K/uL   RBC 4.27 4.22 - 5.81 MIL/uL   Hemoglobin 14.2 13.0 - 17.0 g/dL   HCT 19.3 (L) 79.0 - 24.0 %   MCV 89.5 80.0 - 100.0 fL   MCH 33.3 26.0 - 34.0 pg   MCHC 37.2 (H) 30.0 - 36.0 g/dL   RDW 97.3 53.2 - 99.2 %   Platelets 272 150 - 400 K/uL   nRBC 0.0 0.0 - 0.2 %    Comment: Performed at [facility], [address] [place], [place] [address]   CT ABDOMEN PELVIS W CONTRAST  Result Date: 12/05/2021 CLINICAL DATA:  RLQ abdominal pain (Age >= 14y). 36 y/o male. RLQ abd pain with nausea that started yesterday, unknown fever at home States diarrhea as well, decreased appetite EXAM: CT ABDOMEN AND PELVIS WITH CONTRAST TECHNIQUE: Multidetector CT imaging of the abdomen and pelvis was performed using the standard protocol following bolus administration of intravenous contrast. RADIATION DOSE REDUCTION: This exam was performed according to the departmental dose-optimization program which includes automated exposure control, adjustment of the mA and/or kV according to patient size and/or use of iterative reconstruction technique. CONTRAST:  OMNIPAQUE IOHEXOL 300 MG/ML  SOLN COMPARISON:  None Available. FINDINGS: Lower chest: Right lower lobe subsegmental atelectasis. No acute abnormality. Hepatobiliary: Mildly enlarged hepatic parenchyma. No focal liver abnormality. Calcified gallstone noted  within the gallbladder lumen. No gallbladder wall thickening or pericholecystic fluid. No biliary dilatation. Pancreas: No focal lesion. Normal pancreatic contour. No surrounding inflammatory changes. No main pancreatic ductal dilatation. Spleen: Normal in size without focal abnormality. Adrenals/Urinary Tract: No adrenal nodule bilaterally. Bilateral kidneys enhance symmetrically. Enlarged right kidney compared to the left. No hydronephrosis. No hydroureter. The  urinary bladder is unremarkable. Stomach/Bowel: Stomach is within normal limits. No evidence of bowel wall thickening or dilatation. Enlarged appendiceal caliber measuring up to 1.3 cm with associated periappendiceal fat stranding and free fluid. Several appendicoliths are noted within the appendiceal lumen. No definite appendiceal wall discontinuity. Vascular/Lymphatic: No abdominal aorta or iliac aneurysm. Mild atherosclerotic plaque of the aorta and its branches. No abdominal, pelvic, or inguinal lymphadenopathy. Reproductive: Prostate is unremarkable. Other: Trace right lower quadrant free fluid. No intraperitoneal free gas. No organized fluid collection. Musculoskeletal: Small fat containing umbilical hernia. No suspicious lytic or blastic osseous lesions. No acute displaced fracture. Multilevel degenerative changes of the spine. IMPRESSION: 1. Non-perforated acute appendicitis with associated appendicoliths. 2. Cholelithiasis no CT finding of acute cholecystitis. 3. Mild hepatomegaly. Electronically Signed   By: Tish Frederickson M.D.   On: 12/05/2021 21:31      Assessment/Plan Acute appendicitis The patient has been seen, examined, chart, labs, vitals, imaging personally reviewed.  The patient does appear to have acute uncomplicated appendicitis at this time.  He does have a significant elevation of his white blood cell count at 25,000.  He has been started on antibiotic therapy.  He is currently NPO.  We will plan to proceed to the operating  room today for laparoscopic appendectomy. I have discussed the procedure and risks of appendectomy. The risks include but are not limited to bleeding, infection, wound problems, anesthesia, injury to intra-abdominal organs, possibility of postoperative ileus. He seems to understand and agrees with the plan.  All questions were asked and answered.  He has had prior anesthesia before and states this does not affect his SCA-3.  We will plan to recheck a CBC as well as a CMET in the morning.  FEN - NPO/IVFs VTE - Lovenox ID - rocephin/flagyl Admit - obs  SCA-3 Hyperbilirubinemia -recheck CMET in the morning  I reviewed last 24 h vitals and pain scores, last 48 h intake and output, last 24 h labs and trends, and last 24 h imaging results.  Letha Cape, [facility] 12/06/2021, 10:25 AM Please see Amion for pager number during day hours 7:00am-4:30pm or 7:00am -11:30am on weekends

## 2021-12-06 NOTE — Progress Notes (Signed)
Patient ID: Jonathan Hopkins, male   DOB: Jun 23, 1985, 36 y.o.   MRN: [id number] 21 yom with ab pain and found at [facility]isde er to have appendicitis with elevated wbc.  Transferred to [facility], willplan for appendectomy later this am

## 2021-12-06 NOTE — Anesthesia Procedure Notes (Signed)
Procedure Name: Intubation Date/Time: 12/06/2021 2:18 PM  Performed by: Ezekiel Ina, CRNAPre-anesthesia Checklist: Patient identified, Emergency Drugs available, Suction available and Patient being monitored Patient Re-evaluated:Patient Re-evaluated prior to induction Oxygen Delivery Method: Circle system utilized Preoxygenation: Pre-oxygenation with 100% oxygen Induction Type: IV induction Ventilation: Mask ventilation without difficulty Laryngoscope Size: Miller and 2 Grade View: Grade I Tube type: Oral Tube size: 7.5 mm Number of attempts: 1 Airway Equipment and Method: Stylet Placement Confirmation: ETT inserted through vocal cords under direct vision, positive ETCO2 and breath sounds checked- equal and bilateral Secured at: 22 cm Tube secured with: Tape Dental Injury: Teeth and Oropharynx as per pre-operative assessment

## 2021-12-06 NOTE — Anesthesia Preprocedure Evaluation (Addendum)
Anesthesia Evaluation  Patient identified by MRN, date of birth, ID band Patient awake    Reviewed: Allergy & Precautions, NPO status , Patient's Chart, lab work & pertinent test results  History of Anesthesia Complications Negative for: history of anesthetic complications  Airway Mallampati: II  TM Distance: >3 FB Neck ROM: Full    Dental no notable dental hx.    Pulmonary former smoker,    Pulmonary exam normal        Cardiovascular hypertension, Pt. on medications Normal cardiovascular exam     Neuro/Psych spinocerebellar ataxia type 3    GI/Hepatic Neg liver ROS, appendicitis   Endo/Other  negative endocrine ROS  Renal/GU negative Renal ROS  negative genitourinary   Musculoskeletal negative musculoskeletal ROS (+)   Abdominal   Peds  Hematology negative hematology ROS (+)   Anesthesia Other Findings Day of surgery medications reviewed with patient.  Reproductive/Obstetrics negative OB ROS                            Anesthesia Physical Anesthesia Plan  ASA: 2  Anesthesia Plan: General   Post-op Pain Management: Tylenol PO (pre-op)* and Toradol IV (intra-op)*   Induction: Intravenous  PONV Risk Score and Plan: 3 and Treatment may vary due to age or medical condition, Midazolam, Dexamethasone and Ondansetron  Airway Management Planned: Oral ETT  Additional Equipment: None  Intra-op Plan:   Post-operative Plan: Extubation in OR  Informed Consent: I have reviewed the patients History and Physical, chart, labs and discussed the procedure including the risks, benefits and alternatives for the proposed anesthesia with the patient or authorized representative who has indicated his/her understanding and acceptance.     Dental advisory given  Plan Discussed with: CRNA  Anesthesia Plan Comments:        Anesthesia Quick Evaluation

## 2021-12-06 NOTE — Interval H&P Note (Signed)
History and Physical Interval Note:  12/06/2021 1:48 PM  Jonathan Hopkins  has presented today for surgery, with the diagnosis of appendicitis.  The various methods of treatment have been discussed with the patient and family. After consideration of risks, benefits and other options for treatment, the patient has consented to    Procedure(s): APPENDECTOMY LAPAROSCOPIC (N/A) as a surgical intervention.    The patient's history has been reviewed, patient examined, no change in status, stable for surgery.  I have reviewed the patient's chart and labs.  Questions were answered to the patient's satisfaction.    [name], MD [facility] practice Office: [phone]   [name]

## 2021-12-06 NOTE — Transfer of Care (Signed)
Immediate Anesthesia Transfer of Care Note  Patient: Jonathan Hopkins Needs  Procedure(s) Performed: APPENDECTOMY LAPAROSCOPIC  Patient Location: PACU  Anesthesia Type:General  Level of Consciousness: drowsy  Airway & Oxygen Therapy: Patient Spontanous Breathing and Patient connected to face mask oxygen  Post-op Assessment: Report given to RN and Post -op Vital signs reviewed and stable  Post vital signs: Reviewed and stable  Last Vitals:  Vitals Value Taken Time  BP 126/89 12/06/21 1531  Temp    Pulse 102 12/06/21 1534  Resp 13 12/06/21 1534  SpO2 100 % 12/06/21 1534  Vitals shown include unvalidated device data.  Last Pain:  Vitals:   12/06/21 1349  TempSrc:   PainSc: 3       Patients Stated Pain Goal: 9 (12/06/21 1317)  Complications: No notable events documented.

## 2021-12-07 ENCOUNTER — Encounter (HOSPITAL_COMMUNITY): Payer: Self-pay | Admitting: Surgery

## 2021-12-07 LAB — CBC
HCT: 32.3 % — ABNORMAL LOW (ref 39.0–52.0)
Hemoglobin: 11.2 g/dL — ABNORMAL LOW (ref 13.0–17.0)
MCH: 33 pg (ref 26.0–34.0)
MCHC: 34.7 g/dL (ref 30.0–36.0)
MCV: 95.3 fL (ref 80.0–100.0)
Platelets: 177 10*3/uL (ref 150–400)
RBC: 3.39 MIL/uL — ABNORMAL LOW (ref 4.22–5.81)
RDW: 12.4 % (ref 11.5–15.5)
WBC: 19.5 10*3/uL — ABNORMAL HIGH (ref 4.0–10.5)
nRBC: 0 % (ref 0.0–0.2)

## 2021-12-07 LAB — COMPREHENSIVE METABOLIC PANEL
ALT: 14 U/L (ref 0–44)
AST: 13 U/L — ABNORMAL LOW (ref 15–41)
Albumin: 3 g/dL — ABNORMAL LOW (ref 3.5–5.0)
Alkaline Phosphatase: 70 U/L (ref 38–126)
Anion gap: 7 (ref 5–15)
BUN: 11 mg/dL (ref 6–20)
CO2: 26 mmol/L (ref 22–32)
Calcium: 8.4 mg/dL — ABNORMAL LOW (ref 8.9–10.3)
Chloride: 108 mmol/L (ref 98–111)
Creatinine, Ser: 0.86 mg/dL (ref 0.61–1.24)
GFR, Estimated: >60 mL/min (ref >60)
Glucose, Bld: 143 mg/dL — ABNORMAL HIGH (ref 70–99)
Potassium: 4 mmol/L (ref 3.5–5.1)
Sodium: 141 mmol/L (ref 135–145)
Total Bilirubin: 0.7 mg/dL (ref 0.3–1.2)
Total Protein: 5.7 g/dL — ABNORMAL LOW (ref 6.5–8.1)

## 2021-12-07 MED ORDER — OXYCODONE HCL 5 MG PO TABS: 5.0000 mg | ORAL_TABLET | ORAL | 0 refills | Status: DC | PRN

## 2021-12-07 MED ORDER — ACETAMINOPHEN 500 MG PO TABS: 1000.0000 mg | ORAL_TABLET | Freq: Four times a day (QID) | ORAL | 0 refills | Status: AC | PRN

## 2021-12-07 NOTE — Anesthesia Postprocedure Evaluation (Signed)
Anesthesia Post Note  Patient: Jonathan Hopkins  Procedure(s) Performed: APPENDECTOMY LAPAROSCOPIC     Patient location during evaluation: PACU Anesthesia Type: General Level of consciousness: awake and alert Pain management: pain level controlled Vital Signs Assessment: post-procedure vital signs reviewed and stable Respiratory status: spontaneous breathing, nonlabored ventilation, respiratory function stable and patient connected to nasal cannula oxygen Cardiovascular status: blood pressure returned to baseline and stable Postop Assessment: no apparent nausea or vomiting Anesthetic complications: no   No notable events documented.  Last Vitals:  Vitals:   12/07/21 0448 12/07/21 0907  BP: 110/70 (!) 141/92  Pulse: 98 (!) 109  Resp: 17 18  Temp: 37.2 C 36.5 C  SpO2: 97% 91%    Last Pain:  Vitals:   12/07/21 0830  TempSrc:   PainSc: 0-No pain   Pain Goal: Patients Stated Pain Goal: 2 (12/07/21 0547)                 Jennylee Uehara

## 2021-12-07 NOTE — Progress Notes (Signed)
Discharge instructions discussed with patient, verbalized agreement and understanding 

## 2021-12-07 NOTE — TOC Transition Note (Signed)
Transition of Care [facility]) - CM/SW Discharge Note   Patient Details  Name: Jonathan Hopkins MRN: [id number] Date of Birth: Mar 19, 1986  [facility]) CM/SW Contact:  Golda Acre, RN Phone Number: 12/07/2021, 10:26 AM   Clinical Narrative:    Dcd to home with self care no toc needs present   Final next level of care: Home/Self Care Barriers to Discharge: Barriers Resolved   Patient Goals and CMS Choice Patient states their goals for this hospitalization and ongoing recovery are:: to go home asap CMS Medicare.gov Compare Post Acute Care list provided to:: Patient    Discharge Placement                       Discharge Plan and Services   Discharge Planning Services: CM Consult                                 Social Determinants of Health (SDOH) Interventions     Readmission Risk Interventions   No data to display

## 2021-12-07 NOTE — Discharge Summary (Signed)
    Patient ID: Jonathan Hopkins [id number] 1986/02/24 36 y.o.  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  He admitted to nausea but no vomiting.  He did have some diarrhea yesterday.  He admits to a fever here up to 100.1.  Due to persistent right lower quadrant pain that was failing to improve, he presented to med [facility] for further evaluation.  He has been noted to have a white blood cell count of 25,000 and a total bilirubin of 2.1.  He has a CT scan revealing acute uncomplicated appendicitis.  He has been transferred here to [facility] long hospital for further management.  Procedures Lap appy, Dr. Gerrit Friends 9/7  Hospital Course:  The patient was admitted and underwent a laparoscopic appendectomy.  The patient tolerated the procedure well.  On POD 1, the patient was tolerating a regular diet, voiding well, mobilizing, and pain was controlled with oral pain medications.  The patient was stable for DC home at this time with appropriate follow up made.  Physical Exam: Abd: soft, appropriately tender, +BS, ND, incisions c/d/i  Allergies as of 12/07/2021       Reactions   Amoxicillin Swelling        Medication List     TAKE these medications    acetaminophen 500 MG tablet Commonly known as: TYLENOL Take 2 tablets (1,000 mg total) by mouth every 6 (six) hours as needed.   amitriptyline 50 MG tablet Commonly known as: ELAVIL Take 50 mg by mouth at bedtime.   amLODipine 10 MG tablet Commonly known as: NORVASC Take 10 mg by mouth daily.   gabapentin 300 MG  capsule Commonly known as: NEURONTIN Take 1 capsule (300 mg total) by mouth at bedtime.   losartan 25 MG tablet Commonly known as: COZAAR Take 25 mg by mouth daily.   MULTI COMPLETE PO Take 1 capsule by mouth daily.   OMEGA 3 PO Take 1 capsule by mouth daily.   oxybutynin 5 MG 24 hr tablet Commonly known as: DITROPAN-XL Take 5 mg by mouth daily.   oxyCODONE 5 MG immediate release tablet Commonly known as: Oxy IR/ROXICODONE Take 1 tablet (5 mg total) by mouth every 4 (four) hours as needed for moderate pain.          Follow-up Information     Maczis, Hedda Slade, PA-C Follow up on 12/27/2021.   Specialty: General Surgery Why: 3 pm, Arrive 30 minutes prior to your appointment time, Please bring your insurance card and photo ID Contact information: [address] [place] [address] [facility] [place] [facility] [place] [address] [phone]                 Signed: Barnetta Chapel, [facility] 12/07/2021, 9:27 AM Please see Amion for pager number during day hours 7:00am-4:30pm, 7-11:30am on Weekends

## 2021-12-07 NOTE — TOC Initial Note (Signed)
Transition of Care [facility]) - Initial/Assessment Note    Patient Details  Name: Jonathan Hopkins MRN: [id number] Date of Birth: 1985/07/17  Transition of Care [facility]) CM/SW Contact:    Golda Acre, RN Phone Number: 12/07/2021, 7:35 AM  Clinical Narrative:                  Transition of Care [facility]) Screening Note   Patient Details  Name: Jonathan Hopkins Date of Birth: 1985/06/04   Transition of Care [facility]) CM/SW Contact:    Golda Acre, RN Phone Number: 12/07/2021, 7:36 AM    Transition of Care Department [facility]) has reviewed patient and no TOC needs have been identified at this time. We will continue to monitor patient advancement through interdisciplinary progression rounds. If new patient transition needs arise, please place a TOC consult.    Expected Discharge Plan: Home/Self Care Barriers to Discharge: Continued Medical Work up   Patient Goals and CMS Choice Patient states their goals for this hospitalization and ongoing recovery are:: to go home asap CMS Medicare.gov Compare Post Acute Care list provided to:: Patient    Expected Discharge Plan and Services Expected Discharge Plan: Home/Self Care   Discharge Planning Services: CM Consult   Living arrangements for the past 2 months: Apartment                                      Prior Living Arrangements/Services Living arrangements for the past 2 months: Apartment Lives with:: Self Patient language and need for interpreter reviewed:: Yes Do you feel safe going back to the place where you live?: Yes            Criminal Activity/Legal Involvement Pertinent to Current Situation/Hospitalization: No - Comment as needed  Activities of Daily Living Home Assistive Devices/Equipment: Cane (specify quad or straight), Eyeglasses, Grab bars in shower ADL Screening (condition at time of admission) Patient's cognitive ability adequate to safely complete daily activities?: Yes Is the patient deaf or have  difficulty hearing?: No Does the patient have difficulty seeing, even when wearing glasses/contacts?: No Does the patient have difficulty concentrating, remembering, or making decisions?: No Patient able to express need for assistance with ADLs?: Yes Does the patient have difficulty dressing or bathing?: Yes Independently performs ADLs?: No Communication: Independent Dressing (OT): Needs assistance Is this a change from baseline?: Pre-admission baseline Grooming: Independent Feeding: Independent Bathing: Needs assistance Is this a change from baseline?: Pre-admission baseline Toileting: Needs assistance Is this a change from baseline?: Pre-admission baseline In/Out Bed: Needs assistance Is this a change from baseline?: Pre-admission baseline Walks in Home: Independent with device (comment) (cane) Does the patient have difficulty walking or climbing stairs?: Yes Weakness of Legs: Both Weakness of Arms/Hands: Left  Permission Sought/Granted                  Emotional Assessment Appearance:: Appears stated age Attitude/Demeanor/Rapport: Engaged Affect (typically observed): Calm Orientation: : Oriented to Self, Oriented to Place, Oriented to  Time, Oriented to Situation Alcohol / Substance Use: Never Used Psych Involvement: No (comment)  Admission diagnosis:  Appendicitis [K37] Patient Active Problem List   Diagnosis Date Noted   Appendicitis 12/05/2021   Gait abnormality 01/19/2019   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

## 2021-12-10 LAB — SURGICAL PATHOLOGY

## 2022-05-30 ENCOUNTER — Other Ambulatory Visit: Payer: Self-pay

## 2022-05-30 ENCOUNTER — Emergency Department (HOSPITAL_COMMUNITY)
Admission: EM | Admit: 2022-05-30 | Discharge: 2022-05-30 | Disposition: A | Discharge status: Home / Self Care | Payer: Medicare Other | Attending: Emergency Medicine | Admitting: Emergency Medicine

## 2022-05-30 ENCOUNTER — Encounter (HOSPITAL_COMMUNITY): Payer: Self-pay

## 2022-05-30 ENCOUNTER — Emergency Department (HOSPITAL_COMMUNITY): Payer: Medicare Other

## 2022-05-30 DIAGNOSIS — S0181XA Laceration without foreign body of other part of head, initial encounter: Secondary | ICD-10-CM | POA: Diagnosis not present

## 2022-05-30 DIAGNOSIS — W01198A Fall on same level from slipping, tripping and stumbling with subsequent striking against other object, initial encounter: Secondary | ICD-10-CM | POA: Diagnosis not present

## 2022-05-30 DIAGNOSIS — W19XXXA Unspecified fall, initial encounter: Secondary | ICD-10-CM

## 2022-05-30 DIAGNOSIS — S0990XA Unspecified injury of head, initial encounter: Secondary | ICD-10-CM | POA: Diagnosis present

## 2022-05-30 MED ORDER — LIDOCAINE-EPINEPHRINE (PF) 2 %-1:200000 IJ SOLN
20.0000 mL | Freq: Once | INTRAMUSCULAR | Status: AC
  Administered 2022-05-30: 20 mL
  Filled 2022-05-30: qty 20

## 2022-05-30 MED ORDER — ONDANSETRON 4 MG PO TBDP
4.0000 mg | ORAL_TABLET | Freq: Once | ORAL | Status: AC
  Administered 2022-05-30: 4 mg via ORAL
  Filled 2022-05-30: qty 1

## 2022-05-30 MED ORDER — ONDANSETRON HCL 4 MG PO TABS: 4.0000 mg | ORAL_TABLET | Freq: Three times a day (TID) | ORAL | 0 refills | Status: AC | PRN

## 2022-05-30 NOTE — ED Provider Notes (Signed)
[place] AT [facility] Provider Note   CSN: [id number] Arrival date & time: 05/30/22  1903     History  Chief Complaint  Patient presents with   Fall   Facial Laceration    Jonathan Hopkins is a 37 y.o. male with PMH chronic gait disturbance presents to the ED today after a mechanical fall this evening where he tripped and fell on hardwood head striking his head and chin with resultant laceration.  Patient complaining of nausea and lightheadedness post fall but does note that he has had chronic issues with the symptoms and his primary care provider has attributed them to problems with low blood pressure.  He does not take any anticoagulants.  Tetanus vaccination is up-to-date.  He denies any other injury, chest pain, vision changes, abdominal pain, neck pain, back pain, or changes in his gait from baseline.  He does use a cane to ambulate at baseline.       Home Medications Prior to Admission medications   Medication Sig Start Date End Date Taking? Authorizing Provider  ondansetron (ZOFRAN) 4 MG tablet Take 1 tablet (4 mg total) by mouth every 8 (eight) hours as needed for up to 3 days for nausea or vomiting. 05/30/22 06/02/22 Yes Teddie Mehta L, PA-C  acetaminophen (TYLENOL) 500 MG tablet Take 2 tablets (1,000 mg total) by mouth every 6 (six) hours as needed. 12/07/21   Saverio Danker, PA-C  amitriptyline (ELAVIL) 50 MG tablet Take 50 mg by mouth at bedtime. 10/24/20   [provider]  amLODipine (NORVASC) 10 MG tablet Take 10 mg by mouth daily. 10/24/20   [provider]  gabapentin (NEURONTIN) 300 MG capsule Take 1 capsule (300 mg total) by mouth at bedtime. 10/30/20   Kathrynn Ducking, MD  losartan (COZAAR) 25 MG tablet Take 25 mg by mouth daily. 09/11/21   [provider]  Multiple Vitamins-Minerals (MULTI COMPLETE PO) Take 1 capsule by mouth daily.    [provider]  Omega-3 Fatty Acids (OMEGA 3 PO) Take 1 capsule  by mouth daily.    [provider]  oxybutynin (DITROPAN-XL) 5 MG 24 hr tablet Take 5 mg by mouth daily. 10/17/20   [provider]  oxyCODONE (OXY IR/ROXICODONE) 5 MG immediate release tablet Take 1 tablet (5 mg total) by mouth every 4 (four) hours as needed for moderate pain. 12/07/21   Saverio Danker, PA-C      Allergies    Amoxicillin    Review of Systems   Review of Systems  All other systems reviewed and are negative.   Physical Exam Updated Vital Signs BP 101/72   Pulse 82   Temp 98 F (36.7 C) (Oral)   Resp 16   Ht '6\' 1"'$  (1.854 m)   Wt 86.5 kg   SpO2 96%   BMI 25.16 kg/m  Physical Exam Vitals and nursing note reviewed.  Constitutional:      General: He is not in acute distress.    Appearance: Normal appearance. He is not toxic-appearing.  HENT:     Head: Normocephalic.     Comments: 2.5 cm gaping, linear laceration to submental chin, bleeding controlled, entire depth of wound explored without foreign body noted    Mouth/Throat:     Mouth: Mucous membranes are moist.  Eyes:     Extraocular Movements: Extraocular movements intact.     Conjunctiva/sclera: Conjunctivae normal.     Pupils: Pupils are equal, round, and reactive to light.  Cardiovascular:     Rate and Rhythm: Normal rate and regular rhythm.     Heart sounds: No murmur heard. Pulmonary:     Effort: Pulmonary effort is normal.     Breath sounds: Normal breath sounds.  Abdominal:     General: Abdomen is flat.     Palpations: Abdomen is soft.     Tenderness: There is no abdominal tenderness.  Musculoskeletal:        General: No deformity. Normal range of motion.     Cervical back: Normal range of motion and neck supple. No rigidity or tenderness.     Right lower leg: No edema.     Left lower leg: No edema.  Skin:    General: Skin is warm and dry.     Capillary Refill: Capillary refill takes less than 2 seconds.  Neurological:     Mental Status: He is alert. Mental status is at  baseline.  Psychiatric:        Behavior: Behavior normal.     ED Results / Procedures / Treatments   Labs (all labs ordered are listed, but only abnormal results are displayed) Labs Reviewed - No data to display  EKG None  Radiology CT Head Wo Contrast  Result Date: 05/30/2022 CLINICAL DATA:  Ataxia, head trauma; Neck trauma, dangerous injury mechanism (Age 73-64y). Fall, head injury EXAM: CT HEAD WITHOUT CONTRAST CT MAXILLOFACIAL WITHOUT CONTRAST CT CERVICAL SPINE WITHOUT CONTRAST TECHNIQUE: Multidetector CT imaging of the head, cervical spine, and maxillofacial structures were performed using the standard protocol without intravenous contrast. Multiplanar CT image reconstructions of the cervical spine and maxillofacial structures were also generated. RADIATION DOSE REDUCTION: This exam was performed according to the departmental dose-optimization program which includes automated exposure control, adjustment of the mA and/or kV according to patient size and/or use of iterative reconstruction technique. COMPARISON:  None Available. FINDINGS: CT HEAD FINDINGS Brain: No evidence of acute infarction, hemorrhage, hydrocephalus, extra-axial collection or mass lesion/mass effect. Vascular: No hyperdense vessel or unexpected calcification. Skull: Normal. Negative for fracture or focal lesion. Other: Mastoid air cells and middle ear cavities are clear. CT MAXILLOFACIAL FINDINGS Osseous: No fracture or mandibular dislocation. No destructive process. Orbits: Negative. No traumatic or inflammatory finding. Sinuses: Clear. Soft tissues: Shallow transverse laceration superficial to the mandibular mentum noted. CT CERVICAL SPINE FINDINGS Alignment: Normal. Skull base and vertebrae: No acute fracture. No primary bone lesion or focal pathologic process. Soft tissues and spinal canal: No prevertebral fluid or swelling. No visible canal hematoma. Disc levels: There is intervertebral disc space narrowing and endplate  remodeling at C6-7 in keeping with changes of advanced degenerative disc disease. Milder degenerative changes are seen at C4-5 and C5-6. Prevertebral soft tissues are not thickened on sagittal reformats. No significant canal stenosis. Uncovertebral arthrosis results in severe bilateral neuroforaminal narrowing at C6-7. Asymmetric facet arthrosis results in severe left neuroforaminal narrowing at C7-T1. Upper chest: Negative. Other: None IMPRESSION: 1. No acute intracranial abnormality. No calvarial fracture. 2. No acute facial fracture. Shallow transverse laceration superficial to the mandibular mentum. 3. No acute fracture or listhesis of the cervical spine. Electronically Signed   By: Fidela Salisbury M.D.   On: 05/30/2022 20:40   CT Cervical Spine Wo Contrast  Result Date: 05/30/2022 CLINICAL DATA:  Ataxia, head trauma; Neck trauma, dangerous injury mechanism (Age 87-64y). Fall, head injury EXAM: CT HEAD WITHOUT CONTRAST CT MAXILLOFACIAL WITHOUT CONTRAST CT CERVICAL SPINE WITHOUT CONTRAST TECHNIQUE: Multidetector CT imaging of the head, cervical spine, and maxillofacial structures  were performed using the standard protocol without intravenous contrast. Multiplanar CT image reconstructions of the cervical spine and maxillofacial structures were also generated. RADIATION DOSE REDUCTION: This exam was performed according to the departmental dose-optimization program which includes automated exposure control, adjustment of the mA and/or kV according to patient size and/or use of iterative reconstruction technique. COMPARISON:  None Available. FINDINGS: CT HEAD FINDINGS Brain: No evidence of acute infarction, hemorrhage, hydrocephalus, extra-axial collection or mass lesion/mass effect. Vascular: No hyperdense vessel or unexpected calcification. Skull: Normal. Negative for fracture or focal lesion. Other: Mastoid air cells and middle ear cavities are clear. CT MAXILLOFACIAL FINDINGS Osseous: No fracture or mandibular  dislocation. No destructive process. Orbits: Negative. No traumatic or inflammatory finding. Sinuses: Clear. Soft tissues: Shallow transverse laceration superficial to the mandibular mentum noted. CT CERVICAL SPINE FINDINGS Alignment: Normal. Skull base and vertebrae: No acute fracture. No primary bone lesion or focal pathologic process. Soft tissues and spinal canal: No prevertebral fluid or swelling. No visible canal hematoma. Disc levels: There is intervertebral disc space narrowing and endplate remodeling at D34-534 in keeping with changes of advanced degenerative disc disease. Milder degenerative changes are seen at C4-5 and C5-6. Prevertebral soft tissues are not thickened on sagittal reformats. No significant canal stenosis. Uncovertebral arthrosis results in severe bilateral neuroforaminal narrowing at C6-7. Asymmetric facet arthrosis results in severe left neuroforaminal narrowing at C7-T1. Upper chest: Negative. Other: None IMPRESSION: 1. No acute intracranial abnormality. No calvarial fracture. 2. No acute facial fracture. Shallow transverse laceration superficial to the mandibular mentum. 3. No acute fracture or listhesis of the cervical spine. Electronically Signed   By: Fidela Salisbury M.D.   On: 05/30/2022 20:40   CT Maxillofacial Wo Contrast  Result Date: 05/30/2022 CLINICAL DATA:  Ataxia, head trauma; Neck trauma, dangerous injury mechanism (Age 64-64y). Fall, head injury EXAM: CT HEAD WITHOUT CONTRAST CT MAXILLOFACIAL WITHOUT CONTRAST CT CERVICAL SPINE WITHOUT CONTRAST TECHNIQUE: Multidetector CT imaging of the head, cervical spine, and maxillofacial structures were performed using the standard protocol without intravenous contrast. Multiplanar CT image reconstructions of the cervical spine and maxillofacial structures were also generated. RADIATION DOSE REDUCTION: This exam was performed according to the departmental dose-optimization program which includes automated exposure control, adjustment  of the mA and/or kV according to patient size and/or use of iterative reconstruction technique. COMPARISON:  None Available. FINDINGS: CT HEAD FINDINGS Brain: No evidence of acute infarction, hemorrhage, hydrocephalus, extra-axial collection or mass lesion/mass effect. Vascular: No hyperdense vessel or unexpected calcification. Skull: Normal. Negative for fracture or focal lesion. Other: Mastoid air cells and middle ear cavities are clear. CT MAXILLOFACIAL FINDINGS Osseous: No fracture or mandibular dislocation. No destructive process. Orbits: Negative. No traumatic or inflammatory finding. Sinuses: Clear. Soft tissues: Shallow transverse laceration superficial to the mandibular mentum noted. CT CERVICAL SPINE FINDINGS Alignment: Normal. Skull base and vertebrae: No acute fracture. No primary bone lesion or focal pathologic process. Soft tissues and spinal canal: No prevertebral fluid or swelling. No visible canal hematoma. Disc levels: There is intervertebral disc space narrowing and endplate remodeling at D34-534 in keeping with changes of advanced degenerative disc disease. Milder degenerative changes are seen at C4-5 and C5-6. Prevertebral soft tissues are not thickened on sagittal reformats. No significant canal stenosis. Uncovertebral arthrosis results in severe bilateral neuroforaminal narrowing at C6-7. Asymmetric facet arthrosis results in severe left neuroforaminal narrowing at C7-T1. Upper chest: Negative. Other: None IMPRESSION: 1. No acute intracranial abnormality. No calvarial fracture. 2. No acute facial fracture. Shallow transverse laceration superficial  to the mandibular mentum. 3. No acute fracture or listhesis of the cervical spine. Electronically Signed   By: Fidela Salisbury M.D.   On: 05/30/2022 20:40    Procedures .Marland KitchenLaceration Repair  Date/Time: 05/30/2022 9:45 PM  Performed by: Suzzette Righter, PA-C Authorized by: Suzzette Righter, PA-C   Consent:    Consent obtained:  Verbal   Consent  given by:  Patient   Risks, benefits, and alternatives were discussed: yes     Risks discussed:  Infection, pain, retained foreign body, poor cosmetic result, need for additional repair, nerve damage and poor wound healing   Alternatives discussed:  No treatment, delayed treatment and observation Universal protocol:    Procedure explained and questions answered to patient or proxy's satisfaction: yes     Immediately prior to procedure, a time out was called: yes     Patient identity confirmed:  Verbally with patient Anesthesia:    Anesthesia method:  Local infiltration   Local anesthetic:  Lidocaine 2% WITH epi Laceration details:    Length (cm):  2.5 Pre-procedure details:    Preparation:  Patient was prepped and draped in usual sterile fashion and imaging obtained to evaluate for foreign bodies Exploration:    Limited defect created (wound extended): no     Hemostasis achieved with:  Epinephrine and direct pressure   Imaging outcome: foreign body not noted     Wound exploration: entire depth of wound visualized     Contaminated: no   Treatment:    Area cleansed with:  Povidone-iodine   Amount of cleaning:  Extensive   Irrigation solution:  Sterile saline   Irrigation method:  Tap and syringe   Visualized foreign bodies/material removed: no     Debridement:  None   Undermining:  None   Scar revision: no   Skin repair:    Repair method:  Sutures   Suture size:  4-0   Suture material:  Nylon   Suture technique:  Simple interrupted   Number of sutures:  4 Approximation:    Approximation:  Close Repair type:    Repair type:  Simple Post-procedure details:    Dressing:  Non-adherent dressing   Procedure completion:  Tolerated well, no immediate complications     Medications Ordered in ED Medications  lidocaine-EPINEPHrine (XYLOCAINE W/EPI) 2 %-1:200000 (PF) injection 20 mL (20 mLs Infiltration Given 05/30/22 2132)  ondansetron (ZOFRAN-ODT) disintegrating tablet 4 mg (4 mg  Oral Given 05/30/22 2145)    ED Course/ Medical Decision Making/ A&P                             Medical Decision Making Amount and/or Complexity of Data Reviewed Radiology: ordered. Decision-making details documented in ED Course.  Risk Prescription drug management.   Medical Decision Making:   Jonathan Hopkins is a 37 y.o. male who presented to the ED today with mechanical fall and chin laceration detailed above.    Additional history discussed with patient's family/caregivers.  Patient's presentation is complicated by their history of chronic gait disturbance.  Complete initial physical exam performed, notably the patient was reportedly at his neurological baseline per his and friends report without obvious focal neurological deficit.  He had 2.5 cm linear slightly gaping laceration to the submental chin but bleeding was controlled.  No facial or other bony deformity.  Patient alert and oriented and responding appropriately. Reviewed and confirmed nursing documentation for past medical history, family history, social  history.    Initial Assessment:   With the patient's presentation of mechanical fall, most likely diagnosis is uncomplicated laceration. Other diagnoses were considered including (but not limited to) ICH, fracture, dislocation, retained foreign body, concussion, orbital trauma, neck injury. These are considered less likely due to history of present illness and physical exam findings.   This is most consistent with an acute complicated illness  Initial Plan:  CT brain, maxillofacial, and cervical spine obtained for further evaluation Tetanus vaccination up-to-date per patient Laceration repair using 2% lidocaine with epi for infiltration Zofran for symptomatic management Objective evaluation as reviewed   Initial Study Results:   Radiology:  All images reviewed independently. Agree with radiology report at this time.   CT Head Wo Contrast  Result Date:  05/30/2022 CLINICAL DATA:  Ataxia, head trauma; Neck trauma, dangerous injury mechanism (Age 79-64y). Fall, head injury EXAM: CT HEAD WITHOUT CONTRAST CT MAXILLOFACIAL WITHOUT CONTRAST CT CERVICAL SPINE WITHOUT CONTRAST TECHNIQUE: Multidetector CT imaging of the head, cervical spine, and maxillofacial structures were performed using the standard protocol without intravenous contrast. Multiplanar CT image reconstructions of the cervical spine and maxillofacial structures were also generated. RADIATION DOSE REDUCTION: This exam was performed according to the departmental dose-optimization program which includes automated exposure control, adjustment of the mA and/or kV according to patient size and/or use of iterative reconstruction technique. COMPARISON:  None Available. FINDINGS: CT HEAD FINDINGS Brain: No evidence of acute infarction, hemorrhage, hydrocephalus, extra-axial collection or mass lesion/mass effect. Vascular: No hyperdense vessel or unexpected calcification. Skull: Normal. Negative for fracture or focal lesion. Other: Mastoid air cells and middle ear cavities are clear. CT MAXILLOFACIAL FINDINGS Osseous: No fracture or mandibular dislocation. No destructive process. Orbits: Negative. No traumatic or inflammatory finding. Sinuses: Clear. Soft tissues: Shallow transverse laceration superficial to the mandibular mentum noted. CT CERVICAL SPINE FINDINGS Alignment: Normal. Skull base and vertebrae: No acute fracture. No primary bone lesion or focal pathologic process. Soft tissues and spinal canal: No prevertebral fluid or swelling. No visible canal hematoma. Disc levels: There is intervertebral disc space narrowing and endplate remodeling at D34-534 in keeping with changes of advanced degenerative disc disease. Milder degenerative changes are seen at C4-5 and C5-6. Prevertebral soft tissues are not thickened on sagittal reformats. No significant canal stenosis. Uncovertebral arthrosis results in severe  bilateral neuroforaminal narrowing at C6-7. Asymmetric facet arthrosis results in severe left neuroforaminal narrowing at C7-T1. Upper chest: Negative. Other: None IMPRESSION: 1. No acute intracranial abnormality. No calvarial fracture. 2. No acute facial fracture. Shallow transverse laceration superficial to the mandibular mentum. 3. No acute fracture or listhesis of the cervical spine. Electronically Signed   By: Fidela Salisbury M.D.   On: 05/30/2022 20:40   CT Cervical Spine Wo Contrast  Result Date: 05/30/2022 CLINICAL DATA:  Ataxia, head trauma; Neck trauma, dangerous injury mechanism (Age 11-64y). Fall, head injury EXAM: CT HEAD WITHOUT CONTRAST CT MAXILLOFACIAL WITHOUT CONTRAST CT CERVICAL SPINE WITHOUT CONTRAST TECHNIQUE: Multidetector CT imaging of the head, cervical spine, and maxillofacial structures were performed using the standard protocol without intravenous contrast. Multiplanar CT image reconstructions of the cervical spine and maxillofacial structures were also generated. RADIATION DOSE REDUCTION: This exam was performed according to the departmental dose-optimization program which includes automated exposure control, adjustment of the mA and/or kV according to patient size and/or use of iterative reconstruction technique. COMPARISON:  None Available. FINDINGS: CT HEAD FINDINGS Brain: No evidence of acute infarction, hemorrhage, hydrocephalus, extra-axial collection or mass lesion/mass effect. Vascular: No hyperdense vessel  or unexpected calcification. Skull: Normal. Negative for fracture or focal lesion. Other: Mastoid air cells and middle ear cavities are clear. CT MAXILLOFACIAL FINDINGS Osseous: No fracture or mandibular dislocation. No destructive process. Orbits: Negative. No traumatic or inflammatory finding. Sinuses: Clear. Soft tissues: Shallow transverse laceration superficial to the mandibular mentum noted. CT CERVICAL SPINE FINDINGS Alignment: Normal. Skull base and vertebrae: No  acute fracture. No primary bone lesion or focal pathologic process. Soft tissues and spinal canal: No prevertebral fluid or swelling. No visible canal hematoma. Disc levels: There is intervertebral disc space narrowing and endplate remodeling at D34-534 in keeping with changes of advanced degenerative disc disease. Milder degenerative changes are seen at C4-5 and C5-6. Prevertebral soft tissues are not thickened on sagittal reformats. No significant canal stenosis. Uncovertebral arthrosis results in severe bilateral neuroforaminal narrowing at C6-7. Asymmetric facet arthrosis results in severe left neuroforaminal narrowing at C7-T1. Upper chest: Negative. Other: None IMPRESSION: 1. No acute intracranial abnormality. No calvarial fracture. 2. No acute facial fracture. Shallow transverse laceration superficial to the mandibular mentum. 3. No acute fracture or listhesis of the cervical spine. Electronically Signed   By: Fidela Salisbury M.D.   On: 05/30/2022 20:40   CT Maxillofacial Wo Contrast  Result Date: 05/30/2022 CLINICAL DATA:  Ataxia, head trauma; Neck trauma, dangerous injury mechanism (Age 77-64y). Fall, head injury EXAM: CT HEAD WITHOUT CONTRAST CT MAXILLOFACIAL WITHOUT CONTRAST CT CERVICAL SPINE WITHOUT CONTRAST TECHNIQUE: Multidetector CT imaging of the head, cervical spine, and maxillofacial structures were performed using the standard protocol without intravenous contrast. Multiplanar CT image reconstructions of the cervical spine and maxillofacial structures were also generated. RADIATION DOSE REDUCTION: This exam was performed according to the departmental dose-optimization program which includes automated exposure control, adjustment of the mA and/or kV according to patient size and/or use of iterative reconstruction technique. COMPARISON:  None Available. FINDINGS: CT HEAD FINDINGS Brain: No evidence of acute infarction, hemorrhage, hydrocephalus, extra-axial collection or mass lesion/mass effect.  Vascular: No hyperdense vessel or unexpected calcification. Skull: Normal. Negative for fracture or focal lesion. Other: Mastoid air cells and middle ear cavities are clear. CT MAXILLOFACIAL FINDINGS Osseous: No fracture or mandibular dislocation. No destructive process. Orbits: Negative. No traumatic or inflammatory finding. Sinuses: Clear. Soft tissues: Shallow transverse laceration superficial to the mandibular mentum noted. CT CERVICAL SPINE FINDINGS Alignment: Normal. Skull base and vertebrae: No acute fracture. No primary bone lesion or focal pathologic process. Soft tissues and spinal canal: No prevertebral fluid or swelling. No visible canal hematoma. Disc levels: There is intervertebral disc space narrowing and endplate remodeling at D34-534 in keeping with changes of advanced degenerative disc disease. Milder degenerative changes are seen at C4-5 and C5-6. Prevertebral soft tissues are not thickened on sagittal reformats. No significant canal stenosis. Uncovertebral arthrosis results in severe bilateral neuroforaminal narrowing at C6-7. Asymmetric facet arthrosis results in severe left neuroforaminal narrowing at C7-T1. Upper chest: Negative. Other: None IMPRESSION: 1. No acute intracranial abnormality. No calvarial fracture. 2. No acute facial fracture. Shallow transverse laceration superficial to the mandibular mentum. 3. No acute fracture or listhesis of the cervical spine. Electronically Signed   By: Fidela Salisbury M.D.   On: 05/30/2022 20:40     Final Assessment and Plan:   This is a 37 year old male presenting to the ED post mechanical fall with laceration to submental chin that occurred this evening.  Patient at neurological baseline.  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  He reports no acute changes in his gait.  He does also note nausea and lightheadedness that has somewhat had the symptoms chronically with his primary care  provider believing they were due to problems with hypotension.  Blood pressure at the lower end of normal but stable in the emergency department today.  With head injury and history of recurrent falls, CT imaging of head, maxillofacial, and cervical spine obtained.  This imaging did not show any acute abnormalities.  Laceration repair was completed.  Wound was extensively cleaned and the full depth of the wound was explored with no noted foreign body.  Just prior to laceration repair, patient reported that lying down caused him to have some nausea and lightheadedness so Zofran was ordered for symptomatic management in order to complete procedure.  Patient had good relief of symptoms with this medication.  2% lidocaine with epinephrine used for infiltration for anesthesia with good pain relief.  4 simple, interrupted sutures placed to 2.5 cm slightly gaping, linear laceration and good cosmetic results.  Patient pleased with laceration repair and reporting he feels at his baseline.  Discussed with patient need to return to PCP, urgent care, or ED in 5 to 7 days for suture removal.  Discussed wound care instructions with patient.  Small amount of Zofran provided for home for symptomatic management until pt can follow up with PCP. Patient/friend expressed understanding of treatment plan.  Given strict ED return precautions, all questions answered, and patient stable for discharge.   Clinical Impression:  1. Chin laceration, initial encounter   2. Fall, initial encounter      Discharge           Final Clinical Impression(s) / ED Diagnoses Final diagnoses:  Chin laceration, initial encounter  Fall, initial encounter    Rx / DC Orders ED Discharge Orders          Ordered    ondansetron (ZOFRAN) 4 MG tablet  Every 8 hours PRN        05/30/22 2215              Suzzette Righter, PA-C 05/30/22 2234    Blanchie Dessert, MD 06/03/22 [phone]

## 2022-05-30 NOTE — ED Triage Notes (Signed)
Pt ambulates with cane. About 1830 had trip and fall on hardwood floors at home striking chin, lac noted, bleeding controlled.  States he feels " loopy"

## 2022-05-30 NOTE — Discharge Instructions (Signed)
Thank you for letting us take care of you today.  Your imaging was normal. We repaired the laceration to your chin with 4 sutures. Please go to your primary care provider, an urgent care, or an emergency department in 5-7 days to have the sutures removed. If you develop significant redness, tenderness, or drainage from the area or other new symptoms such as fever, chills, nausea, or vomiting, please be evaluated sooner.  I am prescribing you a small amount of the nausea medication for you to use at home as needed for nausea and lightheadedness. If you notice relief with this from your typical problems with these symptoms, please discuss with your primary care provider if this is a medication you can take routinely.

## 2023-07-23 ENCOUNTER — Observation Stay (HOSPITAL_COMMUNITY)
Admission: EM | Admit: 2023-07-23 | Discharge: 2023-07-25 | Disposition: A | Discharge status: Home / Self Care | Attending: Family Medicine | Admitting: Family Medicine

## 2023-07-23 ENCOUNTER — Emergency Department (HOSPITAL_COMMUNITY)

## 2023-07-23 ENCOUNTER — Other Ambulatory Visit: Payer: Self-pay

## 2023-07-23 ENCOUNTER — Encounter (HOSPITAL_COMMUNITY): Payer: Self-pay | Admitting: Emergency Medicine

## 2023-07-23 DIAGNOSIS — Z87891 Personal history of nicotine dependence: Secondary | ICD-10-CM | POA: Diagnosis not present

## 2023-07-23 DIAGNOSIS — F909 Attention-deficit hyperactivity disorder, unspecified type: Secondary | ICD-10-CM | POA: Diagnosis not present

## 2023-07-23 DIAGNOSIS — N3281 Overactive bladder: Secondary | ICD-10-CM | POA: Diagnosis not present

## 2023-07-23 DIAGNOSIS — I10 Essential (primary) hypertension: Secondary | ICD-10-CM | POA: Insufficient documentation

## 2023-07-23 DIAGNOSIS — Z79899 Other long term (current) drug therapy: Secondary | ICD-10-CM | POA: Insufficient documentation

## 2023-07-23 DIAGNOSIS — M7989 Other specified soft tissue disorders: Principal | ICD-10-CM

## 2023-07-23 DIAGNOSIS — Q742 Other congenital malformations of lower limb(s), including pelvic girdle: Secondary | ICD-10-CM

## 2023-07-23 DIAGNOSIS — W19XXXA Unspecified fall, initial encounter: Secondary | ICD-10-CM | POA: Diagnosis not present

## 2023-07-23 DIAGNOSIS — F419 Anxiety disorder, unspecified: Secondary | ICD-10-CM | POA: Insufficient documentation

## 2023-07-23 DIAGNOSIS — S76091A Other specified injury of muscle, fascia and tendon of right hip, initial encounter: Principal | ICD-10-CM | POA: Insufficient documentation

## 2023-07-23 DIAGNOSIS — D649 Anemia, unspecified: Secondary | ICD-10-CM | POA: Insufficient documentation

## 2023-07-23 DIAGNOSIS — G118 Other hereditary ataxias: Secondary | ICD-10-CM | POA: Diagnosis not present

## 2023-07-23 DIAGNOSIS — R296 Repeated falls: Secondary | ICD-10-CM

## 2023-07-23 DIAGNOSIS — Z789 Other specified health status: Secondary | ICD-10-CM

## 2023-07-23 LAB — CBC WITH DIFFERENTIAL/PLATELET
Abs Immature Granulocytes: 0.04 10*3/uL (ref 0.00–0.07)
Basophils Absolute: 0 10*3/uL (ref 0.0–0.1)
Basophils Relative: 0 %
Eosinophils Absolute: 0.2 10*3/uL (ref 0.0–0.5)
Eosinophils Relative: 1 %
HCT: 30.9 % — ABNORMAL LOW (ref 39.0–52.0)
Hemoglobin: 10.5 g/dL — ABNORMAL LOW (ref 13.0–17.0)
Immature Granulocytes: 0 %
Lymphocytes Relative: 10 %
Lymphs Abs: 1.3 10*3/uL (ref 0.7–4.0)
MCH: 32.7 pg (ref 26.0–34.0)
MCHC: 34 g/dL (ref 30.0–36.0)
MCV: 96.3 fL (ref 80.0–100.0)
Monocytes Absolute: 1.3 10*3/uL — ABNORMAL HIGH (ref 0.1–1.0)
Monocytes Relative: 10 %
Neutro Abs: 10.8 10*3/uL — ABNORMAL HIGH (ref 1.7–7.7)
Neutrophils Relative %: 79 %
Platelets: 313 10*3/uL (ref 150–400)
RBC: 3.21 MIL/uL — ABNORMAL LOW (ref 4.22–5.81)
RDW: 12.8 % (ref 11.5–15.5)
WBC: 13.7 10*3/uL — ABNORMAL HIGH (ref 4.0–10.5)
nRBC: 0 % (ref 0.0–0.2)

## 2023-07-23 LAB — BASIC METABOLIC PANEL WITH GFR
Anion gap: 10 (ref 5–15)
BUN: 15 mg/dL (ref 6–20)
CO2: 24 mmol/L (ref 22–32)
Calcium: 8.9 mg/dL (ref 8.9–10.3)
Chloride: 102 mmol/L (ref 98–111)
Creatinine, Ser: 0.94 mg/dL (ref 0.61–1.24)
GFR, Estimated: >60 mL/min (ref >60)
Glucose, Bld: 104 mg/dL — ABNORMAL HIGH (ref 70–99)
Potassium: 4.3 mmol/L (ref 3.5–5.1)
Sodium: 136 mmol/L (ref 135–145)

## 2023-07-23 MED ORDER — OXYCODONE HCL 5 MG PO TABS
5.0000 mg | ORAL_TABLET | Freq: Once | ORAL | Status: AC
  Administered 2023-07-23: 5 mg via ORAL
  Filled 2023-07-23: qty 1

## 2023-07-23 NOTE — ED Triage Notes (Signed)
 Pt BIB by EMS from UC regarding RLE swelling s/p fall x 3 days ago. Unable to bear weight, bruising noted. Per EMS, UC concerned for compartment syndrome. Pt also endorses lower back pain. Hx of neurological disorder at baseline.

## 2023-07-23 NOTE — ED Provider Triage Note (Signed)
 Emergency Medicine Provider Triage Evaluation Note  Jonathan Hopkins , a 38 y.o. male  was evaluated in triage.  Pt complains of right hip pain and bruising after fall, no thinners, no hitting head, no LOC.  Review of Systems  Positive: Right hip and back pain, brusing Negative: Weakness numbness tingling  Physical Exam  BP 107/61 (BP Location: Right Arm)   Pulse (!) 103   Temp 98.3 F (36.8 C)   Resp 18   SpO2 100%  Gen:   Awake, no distress   Resp:  Normal effort  MSK:   Moves extremities without difficulty TTP to right hip and back Other:  Bruising to right hamstring area  Medical Decision Making  Medically screening exam initiated at 4:19 PM.  Appropriate orders placed.  Jonathan Hopkins was informed that the remainder of the evaluation will be completed by another provider, this initial triage assessment does not replace that evaluation, and the importance of remaining in the ED until their evaluation is complete.     Jonathan Rue, DO 07/23/23 1620

## 2023-07-24 ENCOUNTER — Emergency Department (HOSPITAL_COMMUNITY)

## 2023-07-24 DIAGNOSIS — R296 Repeated falls: Secondary | ICD-10-CM

## 2023-07-24 DIAGNOSIS — Z789 Other specified health status: Secondary | ICD-10-CM

## 2023-07-24 DIAGNOSIS — S76091A Other specified injury of muscle, fascia and tendon of right hip, initial encounter: Secondary | ICD-10-CM | POA: Diagnosis not present

## 2023-07-24 DIAGNOSIS — D649 Anemia, unspecified: Secondary | ICD-10-CM | POA: Insufficient documentation

## 2023-07-24 DIAGNOSIS — Q742 Other congenital malformations of lower limb(s), including pelvic girdle: Secondary | ICD-10-CM

## 2023-07-24 LAB — CBC
HCT: 29.9 % — ABNORMAL LOW (ref 39.0–52.0)
Hemoglobin: 10.1 g/dL — ABNORMAL LOW (ref 13.0–17.0)
MCH: 32.4 pg (ref 26.0–34.0)
MCHC: 33.8 g/dL (ref 30.0–36.0)
MCV: 95.8 fL (ref 80.0–100.0)
Platelets: 275 10*3/uL (ref 150–400)
RBC: 3.12 MIL/uL — ABNORMAL LOW (ref 4.22–5.81)
RDW: 13.2 % (ref 11.5–15.5)
WBC: 8.4 10*3/uL (ref 4.0–10.5)
nRBC: 0 % (ref 0.0–0.2)

## 2023-07-24 LAB — HIV ANTIBODY (ROUTINE TESTING W REFLEX): HIV Screen 4th Generation wRfx: NONREACTIVE

## 2023-07-24 MED ORDER — OXYBUTYNIN CHLORIDE ER 5 MG PO TB24
5.0000 mg | ORAL_TABLET | Freq: Every day | ORAL | Status: DC
Start: 2023-07-24 — End: 2023-07-25
  Administered 2023-07-25: 5 mg via ORAL
  Filled 2023-07-24 (×2): qty 1

## 2023-07-24 MED ORDER — AMITRIPTYLINE HCL 50 MG PO TABS
50.0000 mg | ORAL_TABLET | Freq: Every day | ORAL | Status: DC
Start: 2023-07-24 — End: 2023-07-25
  Administered 2023-07-24: 50 mg via ORAL
  Filled 2023-07-24: qty 1

## 2023-07-24 MED ORDER — ACETAMINOPHEN 325 MG PO TABS
650.0000 mg | ORAL_TABLET | Freq: Four times a day (QID) | ORAL | Status: DC
  Administered 2023-07-24 – 2023-07-25 (×4): 650 mg via ORAL
  Filled 2023-07-24 (×4): qty 2

## 2023-07-24 MED ORDER — ACETAMINOPHEN 650 MG RE SUPP: 650.0000 mg | Freq: Four times a day (QID) | RECTAL | Status: DC | PRN

## 2023-07-24 MED ORDER — ADULT MULTIVITAMIN W/MINERALS CH
1.0000 | ORAL_TABLET | Freq: Every day | ORAL | Status: DC
  Administered 2023-07-24 – 2023-07-25 (×2): 1 via ORAL
  Filled 2023-07-24 (×2): qty 1

## 2023-07-24 MED ORDER — ACETAMINOPHEN 325 MG PO TABS: 650.0000 mg | ORAL_TABLET | Freq: Four times a day (QID) | ORAL | Status: DC | PRN

## 2023-07-24 MED ORDER — HYDROCODONE-ACETAMINOPHEN 5-325 MG PO TABS
2.0000 | ORAL_TABLET | Freq: Once | ORAL | Status: AC
  Administered 2023-07-24: 2 via ORAL
  Filled 2023-07-24: qty 2

## 2023-07-24 MED ORDER — ENOXAPARIN SODIUM 40 MG/0.4ML IJ SOSY
40.0000 mg | PREFILLED_SYRINGE | INTRAMUSCULAR | Status: DC
  Administered 2023-07-24: 40 mg via SUBCUTANEOUS
  Filled 2023-07-24: qty 0.4

## 2023-07-24 MED ORDER — OXYCODONE HCL 5 MG PO TABS
5.0000 mg | ORAL_TABLET | Freq: Four times a day (QID) | ORAL | Status: DC | PRN
  Administered 2023-07-24 – 2023-07-25 (×2): 5 mg via ORAL
  Filled 2023-07-24 (×2): qty 1

## 2023-07-24 MED ORDER — BUPROPION HCL ER (XL) 150 MG PO TB24
300.0000 mg | ORAL_TABLET | Freq: Every morning | ORAL | Status: DC
  Administered 2023-07-24 – 2023-07-25 (×2): 300 mg via ORAL
  Filled 2023-07-24 (×2): qty 2

## 2023-07-24 MED ORDER — MULTI COMPLETE PO CAPS: 1.0000 | ORAL_CAPSULE | Freq: Every day | ORAL | Status: DC

## 2023-07-24 MED ORDER — GABAPENTIN 300 MG PO CAPS
300.0000 mg | ORAL_CAPSULE | Freq: Every day | ORAL | Status: DC
Start: 2023-07-24 — End: 2023-07-25
  Administered 2023-07-24: 300 mg via ORAL
  Filled 2023-07-24: qty 1

## 2023-07-24 MED ORDER — AMPHETAMINE-DEXTROAMPHET ER 10 MG PO CP24
30.0000 mg | ORAL_CAPSULE | Freq: Every day | ORAL | Status: DC
  Administered 2023-07-24 – 2023-07-25 (×2): 30 mg via ORAL
  Filled 2023-07-24 (×2): qty 3

## 2023-07-24 MED ORDER — ACETAMINOPHEN 650 MG RE SUPP: 650.0000 mg | Freq: Four times a day (QID) | RECTAL | Status: DC

## 2023-07-24 NOTE — ED Provider Notes (Signed)
 Accepted handoff at shift change from Marjorie Sieving, PA-C. Please see prior provider note for more detail.   Briefly: Patient is 38 y.o. presenting for multiple falls with associated right hip and right leg pain.  History of spinocerebellar ataxia.  DDX: concern for compartment syndrome, hip fracture or dislocation  Plan: f/u on CT right hip  Physical Exam  BP 103/68   Pulse 80   Temp 97.8 F (36.6 C) (Oral)   Resp 16   Ht 6\' 1"  (1.854 m)   Wt 79.4 kg   SpO2 100%   BMI 23.09 kg/m   Physical Exam  Procedures  Procedures  ED Course / MDM   Clinical Course as of 07/24/23 0924  Thu Jul 24, 2023  0628 DG Foot Complete Right [JG]  1610 Spinocerebellar ataxia. 9 falls in the last few days. Fell on right hip multiple times. Warm well perfused. Maybe a discharge.  [JR]    Clinical Course User Index [JG] Juanetta Nordmann, PA [JR] Janalee Mcmurray, PA-C   Medical Decision Making Amount and/or Complexity of Data Reviewed Labs: ordered. Radiology: ordered. Decision-making details documented in ED Course. ECG/medicine tests: ordered.  Risk Prescription drug management. Decision regarding hospitalization.   CT of the right hip concerning for a nondisplaced femoral neck fracture.  Patient also unable to ambulate at this time with a walker, which he uses at home.  Admitted to hospital service.  I have reached out the Steffanie Edouard, [place] of orthopedics.  Awaiting his recommendations.  Also ordered MRI of the right hip as well per radiologist recs. Admitted to hospital service by Dr. Genora Kidd.        Janalee Mcmurray, PA-C 07/24/23 1047    Trish Furl, MD 07/25/23 [phone]

## 2023-07-24 NOTE — Care Management Obs Status (Signed)
 MEDICARE OBSERVATION STATUS NOTIFICATION   Patient Details  Name: Jonathan Hopkins MRN: [id number] Date of Birth: Oct 07, 1985   Medicare Observation Status Notification Given:  Yes    Terre Ferri, RN 07/24/2023, 4:32 PM

## 2023-07-24 NOTE — Assessment & Plan Note (Addendum)
 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 - Admit to FMTS, med surg, attending Dr. Drue Gerald - Orthopedics consulted by EDP, appreciate recommendations - Scheduled tylenol  650 mg q6h - Oxy 5 mg q6h PRN for severe pain - PT/OT - Fall precautions - F/u MRI Hip and Lumbar Spine read - AM CBC

## 2023-07-24 NOTE — Assessment & Plan Note (Addendum)
 CT R Hip with subtle cortical irregularity identified along the femoral neck. Per secure chat with orthopedics Jonathan Hopkins) - need to wait for formal MRI read however does not look acutely like a fx but has something in his post musculature on the T2-weighted images. Compartments soft but significant bruising.  - F/u MRI Hip and Lumbar Spine Read - F/u Orthopedics recs

## 2023-07-24 NOTE — ED Notes (Signed)
 Patient transported to MRI

## 2023-07-24 NOTE — Plan of Care (Addendum)
 FMTS Brief Progress Note  S: On interview, no new changes. Patient had removed his IV as it was bothering him. Not currently taking IV medications. Pain well-controlled. Denied chest pain, shortness of breath and new fever. Asked after treatment plan, questions answered.   O: BP 135/88 (BP Location: Right Arm)   Pulse 71   Temp 98.1 F (36.7 C) (Oral)   Resp 16   Ht 6\' 1"  (1.854 m)   Wt 79.4 kg   SpO2 100%   BMI 23.09 kg/m    Constitutional: NAD, attentive to interview. Cardiovascular: RRR, no M/R/G. Respiratory: CTAB. Abdominal: BS+, nontender to light palpation in 4 quadrants. Neuro: No gross deficits. Psych: Appropriate mood and affect.  No new labs. No new imaging.   A/P: R gluteus muscle tear  - Conservative management. No new changes overnight. Will receive further PT/OT eval/treatment tomorrow. Ortho recommends follow-up next week if conservative management helps.  - Orders reviewed. Labs for AM ordered, which were adjusted as needed.  - If condition changes, plan includes reevaluation.   Gwyndolyn Lerner, MD 07/24/2023, 7:19 PM PGY-1, [facility] Night Resident  Please page [phone] with questions.

## 2023-07-24 NOTE — ED Notes (Signed)
 Patient transported to CT

## 2023-07-24 NOTE — Assessment & Plan Note (Addendum)
 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

## 2023-07-24 NOTE — Assessment & Plan Note (Addendum)
 Baseline normal around 14, here down to 10.6 normocytic. Extensive brusing +/- hematoma in posterior thigh likely source vs possible R femoral neck fracture. - CBC - Start heparin VTE ppx pending results

## 2023-07-24 NOTE — H&P (Addendum)
 Hospital Admission History and Physical Service Pager: [phone]  Patient name: Jonathan Hopkins Medical record number: [id number] Date of Birth: 05/24/1985 Age: 38 y.o. Gender: male  Primary Care Provider: Fonda Hymen, NP Consultants: Orthopedics Code Status: FULL  Preferred Emergency Contact:  Ludger [place] [phone]   Chief Complaint: Right leg pain  Assessment and Plan: Jonathan Hopkins is a 38 y.o. male PMH SCA3, ADHD, OAB, HTN, Anxiety/MDD, Insomnia presenting with right leg pain after frequent falls . Differential for this patient's presentation of this includes hip fracture, hematoma/MSK swelling. Likely frequent falls due to progressive nature of SCA3.  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 - Admit to FMTS, med surg, attending Dr. Drue Gerald - Orthopedics consulted by EDP, appreciate recommendations - Scheduled tylenol  650 mg q6h - Oxy 5 mg q6h PRN for severe pain - PT/OT - Fall precautions - F/u MRI Hip and Lumbar Spine read - AM CBC Abnormality of femur CT R Hip with subtle cortical irregularity identified along the femoral neck. Per secure chat with orthopedics Starr Eddy) - need to wait for formal MRI read however does not look acutely like a fx but has something in his post musculature on the T2-weighted images. Compartments soft but significant bruising.  - F/u MRI Hip and Lumbar Spine Read - F/u Orthopedics recs Anemia Baseline normal around 14, here down to 10.6 normocytic. Extensive brusing +/- hematoma in posterior thigh likely source vs possible R femoral neck fracture. 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 male presenting with right leg pain after multiple falls.  The initial fall occurred three days ago, followed by three falls the next day, and one fall the day after. The falls were due to loss of balance, a known issue due to their neurological disease. He fell backwards each time, landing on their right buttock, and reports significant pain in that area. States that his right leg "gives out on him" causing him to fall. He denies loss of consciousness, head injury, or any use of blood thinners. He was unable to walk immediately after the falls and had to drag/scoot himself to a location where he could pull himself up (to the kitchen near fridge to use the handle). He denies any dizziness, lightheadedness, chest pain or palpitations prior to falls. He reports increased frequency of falls over the past year. He mostly uses a cane for mobility and has a walker at home, but tries to avoid using it as he does not want to depend on it.  (Dr. Remigio Carl is his Neurologist)  Carolin Chyle 4/23 initially went to the urgent care and they were concerned about compartment syndrome versus DVT and was sent to emergency department for evaluation.  In the ED, his vitals remained stable, x-rays of foot and tibia were negative for fractures.  CT hip did show a subtle cortical irregularity of the femoral neck.  Does have some abnormal low-attenuation throughout the  posterior compartment musculature possibly edema versus hematoma.  He was unable to ambulate in ED and required oxycodone  and hydrocodone  for pain control.  EDP ordered MRI of hip and consulted orthopedic surgery.  Orthopedic surgery added on lumbar spine MRI and are awaiting reads before formal  recommendations.  Review Of Systems: Per HPI  Pertinent Past Medical History: SCA3, ADHD, OAB, HTN, Anxiety/MDD, Insomnia Remainder reviewed in history tab.   Pertinent Past Surgical History: Thyroglossal duct cyst Appendectomy Wisdom tooth surgery   Remainder reviewed in history tab.  Pertinent Social History: Tobacco use: Former quit 7 years ago,  smoked 10 years 1 PPD Alcohol use: None Other Substance use: None Lives with sister and niece  Pertinent Family History: SCA Mom and Aunt SCA Sisters x 3  Remainder reviewed in history tab.   Important Outpatient Medications: Amitriptyline  50 mg qhs Amlodipine 10 mg Adderall XR 30 mg Bupropion  XL 300 mg Lexapro 20 mg Fish oil daily Gabapentin  300 mg nightly Losartan 25 mg daily Multivitamin Oxybutynin  XL 5 mg Remainder reviewed in medication history.   Objective: BP 103/68   Pulse 80   Temp 97.8 F (36.6 C) (Oral)   Resp 16   Ht 6\' 1"  (1.854 m)   Wt 79.4 kg   SpO2 100%   BMI 23.09 kg/m  Exam: General: NAD, awake, alert, responsive to all questions Eyes: Equal pupils ENTM: Moist mucous membranes Neck: Soft, no masses Cardiovascular: Regular rate and rhythm, no murmurs rubs or gallops Respiratory: Clear to auscultation bilaterally, no wheezes rales or crackles, no increased work of breathing on room air Gastrointestinal: Soft, nontender to palpation MSK: No spinal process tenderness, Left lower extremity with no edema or bruising present, right lower extremity with significant ecchmyosis present throughout leg from foot up to hip, 2+ DP and PT pulses bilaterally, warm and well-perfused, sensation intact bilaterally, tenderness over right femoral head FABER + FADIR elicits pain on right 5/5 strength with lifting legs off bed b/l Derm: ecchymosis significant (see picture) Neuro: Alert and oriented, no obvious deficits Psych: Mod appropriate, pleasant    Labs:  CBC BMET  Recent Labs  Lab 07/23/23 1629   WBC 13.7*  HGB 10.5*  HCT 30.9*  PLT 313   Recent Labs  Lab 07/23/23 1629  NA 136  K 4.3  CL 102  CO2 24  BUN 15  CREATININE 0.94  GLUCOSE 104*  CALCIUM 8.9    Pertinent additional labs.   EKG: NSR, wandering baseline  Imaging Studies Performed:  CT Hip Right - subtle cortical irregularity identified along the femoral neck, edema/hematoma of posterior compartment musculature, soft tissue edema posterior compartment proximal thigh/gluteal region  DG tibia/fibula-no bone abnormality, mild soft tissue edema  DG right foot negative   Genora Kidd, MD 07/24/2023, 10:58 AM PGY-3, [facility]  FPTS Intern pager: [phone], text pages welcome Secure chat group [facility] Teaching Service

## 2023-07-24 NOTE — ED Provider Notes (Signed)
 [place] [facility] AT [facility] Provider Note   CSN: [id number] Arrival date & time: 07/23/23  2841     History Chief Complaint  Patient presents with   Leg Swelling    HPI Jonathan Hopkins is a 38 y.o. male presenting for right leg swelling and pain from multiple falls over the last several weeks due to a pre-existing gait abnormality.  He visited local urgent care today and they referred him to this ED by ambulance out of concern for potential compartment syndrome.  He denies any numbness or paresthesia in the right leg, does endorse edema and bruising, but has had no noted incidences of pallor or loss of sensitivity in the affected extremity.  It is difficult for him to bear weight on the right leg due to pain around the right greater trochanter with weightbearing.  Further he endorses difficult flexion at the knee due to stiffness and pain again at the right greater trochanter.  He has a previous medical history of hypertension, ADHD, and spinocerebellar ataxia.  He takes amitriptyline , Norvasc, Cozaar, and oxybutynin .   Patient's recorded medical, surgical, social, medication list and allergies were reviewed in the Snapshot window as part of the initial history.   Review of Systems   Review of Systems  Musculoskeletal:        Pain to the right lateral upper leg and thigh, with bruising along the entire right lower extremity.  Skin:  Positive for color change. Negative for pallor.       No pallor of the right lower extremity, color changes of ecchymosis to the right leg.  Neurological:  Negative for numbness.    Physical Exam Updated Vital Signs BP 113/63   Pulse 81   Temp 98 F (36.7 C) (Oral)   Resp 16   SpO2 100%  Physical Exam Vitals and nursing note reviewed.  Constitutional:      General: He is not in acute distress.    Appearance: Normal appearance.  HENT:     Head: Normocephalic and atraumatic.     Mouth/Throat:     Mouth: Mucous  membranes are moist.     Pharynx: Oropharynx is clear.  Eyes:     Extraocular Movements: Extraocular movements intact.     Conjunctiva/sclera: Conjunctivae normal.     Pupils: Pupils are equal, round, and reactive to light.  Cardiovascular:     Rate and Rhythm: Normal rate and regular rhythm.     Pulses: Normal pulses.          Dorsalis pedis pulses are 2+ on the right side and 2+ on the left side.       Posterior tibial pulses are 2+ on the right side and 2+ on the left side.     Heart sounds: Normal heart sounds. No murmur heard.    No friction rub. No gallop.  Pulmonary:     Effort: Pulmonary effort is normal.     Breath sounds: Normal breath sounds.  Abdominal:     General: Abdomen is flat. Bowel sounds are normal.     Palpations: Abdomen is soft.  Musculoskeletal:     Cervical back: Normal range of motion and neck supple.     Right hip: Tenderness and bony tenderness present. No deformity or crepitus. Decreased range of motion.     Left hip: Normal.     Right upper leg: No edema, deformity, tenderness or bony tenderness.     Left upper leg: Normal.  Right knee: Ecchymosis present.     Left knee: Normal.     Right lower leg: Tenderness and bony tenderness present. No edema.     Left lower leg: No swelling, deformity or tenderness. No edema.     Right ankle: Swelling and ecchymosis present. No deformity.     Left ankle: Normal.     Right foot: No tenderness, bony tenderness or crepitus.     Left foot: No tenderness, bony tenderness or crepitus.     Comments: Tenderness to palpation of the right greater trochanter  Skin:    General: Skin is warm and dry.     Capillary Refill: Capillary refill takes less than 2 seconds.  Neurological:     General: No focal deficit present.     Mental Status: He is alert and oriented to person, place, and time. Mental status is at baseline.     GCS: GCS eye subscore is 4. GCS verbal subscore is 5. GCS motor subscore is 6.     Sensory: No  sensory deficit.     Gait: Gait abnormal.     Comments: Abnormal gated baseline due to spinocerebellar ataxia  Psychiatric:        Mood and Affect: Mood normal.      ED Course/ Medical Decision Making/ A&P Clinical Course as of 07/24/23 0639  Thu Jul 24, 2023  0628 DG Foot Complete Right [JG]  1610 Spina-cerebellar ataxia. 9 falls in the last few days. Fell on right hip multiple times. Warm well perfused. Maybe a discharge.  [JR]    Clinical Course User Index [JG] Jonathan Nordmann, PA [JR] Janalee Mcmurray, PA-C    Procedures Procedures   Medications Ordered in ED Medications  oxyCODONE  (Oxy IR/ROXICODONE ) immediate release tablet 5 mg (5 mg Oral Given 07/23/23 1624)  HYDROcodone -acetaminophen  (NORCO/VICODIN) 5-325 MG per tablet 2 tablet (2 tablets Oral Given 07/24/23 0521)    Medical Decision Making:   Jonathan Hopkins is a 38 y.o. male who presented to the ED today with right lower extremity swelling and discomfort.  Detailed above.     Complete initial physical exam performed, notably the patient  was showing extensive ecchymosis of the right lower extremity from the right buttock distally all the way to the right ankle.  Further noted edema of the right leg.  Of note, he has intact distal circulation as well as intact distal sensation.  Other than ecchymosis there is no discoloration or pallor of the skin noted on the right lower extremity.     Reviewed and confirmed nursing documentation for past medical history, family history, social history.    Initial Assessment:   With the patient's presentation of right leg pain and swelling, most likely diagnosis is multiple contusions of the right lower extremity specific to the right greater trochanter and hip.. Other diagnoses were considered including (but not limited to) compartment syndrome, fracture of the affected structures.. These are considered less likely due to history of present illness and physical exam findings.      Initial Plan:  Obtain imaging of the right hip, right femur, lumbar spine, and right ankle and foot. Screening labs including CBC and Metabolic panel to evaluate for infectious or metabolic etiology of disease.   Objective evaluation as below reviewed   Initial Study Results:   Laboratory  All laboratory results reviewed without evidence of clinically relevant pathology.   exceptions include: None    Radiology:  All images reviewed independently. Agree with radiology report  at this time.   DG Pelvis 1-2 Views Result Date: 07/23/2023 CLINICAL DATA:  Hip pain EXAM: PELVIS - 1-2 VIEW COMPARISON:  None Available. FINDINGS: There is no evidence of pelvic fracture or diastasis. No pelvic bone lesions are seen. IMPRESSION: Negative. Electronically Signed   By: Fredrich Jefferson M.D.   On: 07/23/2023 18:07   DG Lumbar Spine Complete Result Date: 07/23/2023 CLINICAL DATA:  Pain. EXAM: LUMBAR SPINE - COMPLETE 4+ VIEW COMPARISON:  None Available. FINDINGS: There is no evidence of lumbar spine fracture. Alignment is normal. Intervertebral disc spaces are maintained. IMPRESSION: Negative. Electronically Signed   By: Tyron Gallon M.D.   On: 07/23/2023 17:39   DG Femur Min 2 Views Right Result Date: 07/23/2023 CLINICAL DATA:  Pain. EXAM: RIGHT FEMUR 2 VIEWS COMPARISON:  None Available. FINDINGS: There is no evidence of fracture or other focal bone lesions. There is soft tissue swelling overlying the greater trochanter. IMPRESSION: 1. No acute fracture or dislocation. 2. Soft tissue swelling overlying the greater trochanter. Electronically Signed   By: Tyron Gallon M.D.   On: 07/23/2023 17:20      Reassessment and Plan:   After review of imaging, no fractures are noted to any of the bony structures in the right lower extremity.  He does not demonstrate any pain, pallor, paresthesia, or poikilothermia.  Do not suspect compartment syndrome, suspect pain is due to soft tissue injury and bruising from  multiple falls over the last several days.   Currently awaiting official reads by radiology for CT right hip without contrast, as well as foot and right tib-fib.  As all appears to be normal reads, would anticipate discharge with p.o. analgesia and follow-up with primary care as needed for continued management.  Signed out to oncoming PA, Jonathan Hopkins.  Clinical Impression: No diagnosis found.   Data Unavailable   Final Clinical Impression(s) / ED Diagnoses Final diagnoses:  None    Rx / DC Orders ED Discharge Orders     None         Jonathan Nordmann, PA 07/24/23 1610    Earma Gloss, MD 07/24/23 [phone]

## 2023-07-24 NOTE — Evaluation (Signed)
 Physical Therapy Evaluation Patient Details Name: Jonathan Hopkins MRN: [id number] DOB: 07-Nov-1985 Today's Date: 07/24/2023  History of Present Illness  Patient is a 38 y/o male admitted 07/23/23 with R LE pain after recent increase in falls.  MRI positive for R gluteal muscle tears with plan for WBAT potential operative repair.  PMH positive for spinal cerebellar ataxia, ADHD, HTN, anxiety/MDD, insomina.  Clinical Impression  Patient presents with decreased mobility due to R hip pain, decreased ROM and strength and multiple falls at home.  Currently CGA to S for mobility with RW.  States R hip would give out and he would fall though was not initially using RW.  Feel he will benefit from skilled PT in the acute setting and from outpatient PT follow up at d/c.         If plan is discharge home, recommend the following: Help with stairs or ramp for entrance;Assistance with cooking/housework   Can travel by private vehicle        Equipment Recommendations None recommended by PT  Recommendations for Other Services       Functional Status Assessment Patient has had a recent decline in their functional status and demonstrates the ability to make significant improvements in function in a reasonable and predictable amount of time.     Precautions / Restrictions Precautions Precautions: Fall Recall of Precautions/Restrictions: Intact Restrictions Weight Bearing Restrictions Per Provider Order: No RLE Weight Bearing Per Provider Order: Weight bearing as tolerated      Mobility  Bed Mobility Overal bed mobility: Modified Independent                  Transfers Overall transfer level: Needs assistance Equipment used: None, Rolling walker (2 wheels) Transfers: Sit to/from Stand Sit to Stand: Supervision           General transfer comment: initially no device, pt standing prior to environmental set up, cues for waiting for walker, stands with effort though no outside assist  needed    Ambulation/Gait Ambulation/Gait assistance: Supervision Gait Distance (Feet): 65 Feet Assistive device: Rolling walker (2 wheels) Gait Pattern/deviations: Step-to pattern, Step-through pattern, Decreased stride length, Antalgic       General Gait Details: mild antalgia and his baseline ataxia though no LOB noted and limited distance due to pain  Stairs            Wheelchair Mobility     Tilt Bed    Modified Rankin (Stroke Patients Only)       Balance Overall balance assessment: Needs assistance   Sitting balance-[name]: Good Sitting balance - Comments: reaching to don shoes in sitting   Standing balance support: No upper extremity supported Standing balance-[name]: Fair Standing balance comment: static balance without UE support, needs UE support for dynamic activity                             Pertinent Vitals/Pain Pain Assessment Pain Assessment: 0-10 Pain Score: 3  Pain Location: R buttocks Pain Descriptors / Indicators: Sharp Pain Intervention(s): Monitored during session    Home Living Family/patient expects to be discharged to:: Private residence Living Arrangements: Other relatives (sister and niece) Available Help at Discharge: Family Type of Home: House Home Access: Stairs to enter   Secretary/administrator of Steps: 1   Home Layout: One level Home Equipment: Agricultural consultant (2 wheels);Cane - single point;Grab bars - tub/shower Additional Comments: using cane at baseline, has falls regularly,  but not as frequent as past 3 days    Prior Function Prior Level of Function : History of Falls (last six months)             Mobility Comments: reports 8 falls in past 3 days due to L leg giving out       Extremity/Trunk Assessment   Upper Extremity Assessment Upper Extremity Assessment: Defer to OT evaluation    Lower Extremity Assessment Lower Extremity Assessment: RLE deficits/detail RLE Deficits /  Details: noted edema and ecchymosis throughout, able to flex all the way down in sitting to don his shoes though painful; limited hip flexion in supine due to pain and more painful with knee extended, strength not formally tested due to pain though antigravity strength is present       Communication   Communication Communication: No apparent difficulties    Cognition Arousal: Alert Behavior During Therapy: WFL for tasks assessed/performed   PT - Cognitive impairments: No apparent impairments                         Following commands: Intact       Cueing       General Comments General comments (skin integrity, edema, etc.): Encouraged ice, but pt declined    Exercises     Assessment/Plan    PT Assessment Patient needs continued PT services  PT Problem List Decreased mobility;Decreased activity tolerance;Decreased balance;Pain;Decreased knowledge of use of DME       PT Treatment Interventions DME instruction;Therapeutic exercise;Gait training;Balance training;Functional mobility training;Therapeutic activities;Patient/family education    PT Goals (Current goals can be found in the Care Plan section)  Acute Rehab PT Goals Patient Stated Goal: go home PT Goal Formulation: With patient Time For Goal Achievement: 08/07/23 Potential to Achieve Goals: Good    Frequency Min 2X/week     Co-evaluation               AM-PAC PT "6 Clicks" Mobility  Outcome Measure Help needed turning from your back to your side while in a flat bed without using bedrails?: None Help needed moving from lying on your back to sitting on the side of a flat bed without using bedrails?: None Help needed moving to and from a bed to a chair (including a wheelchair)?: A Little Help needed standing up from a chair using your arms (e.g., wheelchair or bedside chair)?: None Help needed to walk in hospital room?: A Little Help needed climbing 3-5 steps with a railing? : A Little 6 Click  Score: 21    End of Session Equipment Utilized During Treatment: Gait belt Activity Tolerance: Patient limited by pain Patient left: in bed;with call bell/phone within reach;with bed alarm set   PT Visit Diagnosis: Other abnormalities of gait and mobility (R26.89);Repeated falls (R29.6);Muscle weakness (generalized) (M62.81);Pain Pain - Right/Left: Right Pain - part of body: Hip    Time: 1610-9604 PT Time Calculation (min) (ACUTE ONLY): 24 min   Charges:   PT Evaluation $PT Eval Moderate Complexity: 1 Mod PT Treatments $Gait Training: 8-22 mins PT General Charges $$ ACUTE PT VISIT: 1 Visit         Abigail Hoff, PT Acute Rehabilitation Services Office:[phone] 07/24/2023   Marley Simmers 07/24/2023, 5:54 PM

## 2023-07-24 NOTE — Progress Notes (Signed)
   07/24/23 1632  Obs Status Letter  Medicare Obs Status Letter given? Yes  Condition Code 44 Given: Yes  Patient signature on CC44 notice: Yes  Documentation of 2 MD's agreement CC44: Yes  Code 44 added to claim: Yes

## 2023-07-24 NOTE — Consult Note (Signed)
 Reason for Consult:Right hip pain Referring Physician: Trish Furl Time called: 3474 Time at bedside: 2595   Jonathan Hopkins is an 38 y.o. male.  HPI: Gregrey presents to the ED with severe right hip/leg pain. He fell 3d ago and had severe pain in the region. He went on to fall another half dozen times since then (he has a progressive neurodegenerative disease and falls frequently). MRI showed mult post chain tears and orthopedic surgery was consulted. He lives at home with family and uses a cane to ambulate.  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 His smoking use included cigarettes. He has never used smokeless tobacco. He reports that he does not currently use alcohol. He reports that he does not use drugs.  Allergies:  Allergies  Allergen Reactions   Amoxicillin Swelling    Medications: I have reviewed the patient's current medications.  Results for orders placed or performed during the hospital encounter of 07/23/23 (from the past 48 hours)  CBC with Differential     Status: Abnormal   Collection Time: 07/23/23  4:29 PM  Result Value Ref Range   WBC 13.7 (H) 4.0 - 10.5 K/uL   RBC 3.21 (L) 4.22 - 5.81 MIL/uL   Hemoglobin 10.5 (L) 13.0 - 17.0 g/dL   HCT 63.8 (L) 75.6 - 43.3 %   MCV 96.3 80.0 - 100.0 fL   MCH 32.7 26.0 -  34.0 pg   MCHC 34.0 30.0 - 36.0 g/dL   RDW 29.5 18.8 - 41.6 %   Platelets 313 150 - 400 K/uL   nRBC 0.0 0.0 - 0.2 %   Neutrophils Relative % 79 %   Neutro Abs 10.8 (H) 1.7 - 7.7 K/uL   Lymphocytes Relative 10 %   Lymphs Abs 1.3 0.7 - 4.0 K/uL   Monocytes Relative 10 %   Monocytes Absolute 1.3 (H) 0.1 - 1.0 K/uL   Eosinophils Relative 1 %   Eosinophils Absolute 0.2 0.0 - 0.5 K/uL   Basophils Relative 0 %   Basophils Absolute 0.0 0.0 - 0.1 K/uL   Immature Granulocytes 0 %   Abs Immature Granulocytes 0.04 0.00 - 0.07 K/uL    Comment: Performed at [facility], [address] [place], [place] [address]  Basic metabolic panel     Status: Abnormal   Collection Time: 07/23/23  4:29 PM  Result Value Ref Range   Sodium 136 135 - 145 mmol/L   Potassium 4.3 3.5 - 5.1 mmol/L   Chloride 102 98 - 111 mmol/L   CO2 24 22 - 32 mmol/L   Glucose, Bld 104 (H) 70 - 99 mg/dL    Comment: Glucose  reference range applies only to samples taken after fasting for at least 8 hours.   BUN 15 6 - 20 mg/dL   Creatinine, Ser 1.61 0.61 - 1.24 mg/dL   Calcium 8.9 8.9 - 09.6 mg/dL   GFR, Estimated >04 >54 mL/min    Comment: (NOTE) Calculated using the CKD-EPI Creatinine Equation (2021)    Anion gap 10 5 - 15    Comment: Performed at [facility], [address] [place], [place] [address]  CBC     Status: Abnormal   Collection Time: 07/24/23 11:26 AM  Result Value Ref Range   WBC 8.4 4.0 - 10.5 K/uL   RBC 3.12 (L) 4.22 - 5.81 MIL/uL   Hemoglobin 10.1 (L) 13.0 - 17.0 g/dL   HCT 91.4 (L) 78.2 - 95.6 %   MCV 95.8 80.0 - 100.0 fL   MCH 32.4 26.0 - 34.0 pg   MCHC 33.8 30.0 - 36.0 g/dL   RDW 21.3 08.6 - 57.8 %   Platelets 275 150 - 400 K/uL   nRBC 0.0 0.0 - 0.2 %    Comment: Performed at [facility], [address] [place], [place] [address]  HIV Antibody (routine testing w rflx)     Status: None   Collection Time: 07/24/23 11:26 AM  Result Value Ref Range   HIV Screen 4th Generation  wRfx Non Reactive Non Reactive    Comment: Performed at [facility], [address], [place], [place] [address]    MR HIP RIGHT WO CONTRAST Result Date: 07/24/2023 CLINICAL DATA:  Right leg swelling and pain from multiple falls over last several weeks. Pain at right greater trochanter. Pre-existing gait abnormality. EXAM: MR OF THE RIGHT HIP WITHOUT CONTRAST TECHNIQUE: Multiplanar, multisequence MR imaging was performed. No intravenous contrast was administered. COMPARISON:  CT abdomen and pelvis 12/05/2021, AP pelvis and right femur radiographs 07/23/2023,, CT right hip 07/24/2023 FINDINGS: Bones: There is no marrow edema within the right femoral neck in the region of the questioned cortical irregularity and superior sclerotic line on today's CT. No MRI evidence of an acute fracture. Minimal superior pubic symphysis joint space narrowing. Articular cartilage and labrum Right hip: Articular cartilage: There is again a degenerative cyst measuring up to approximately 12 mm at the anterior superior right femoral head-neck junction. Mild thinning of the anterior superior right femoroacetabular cartilage. Labrum: No right hip joint effusion. Lack of intra-articular fluid limits evaluation for the right acetabular labral tear. No definite labral tear is seen. On large field-of-view images, mild superior left acetabular cartilage thinning. Mild superolateral left acetabular subchondral degenerative cystic change. Joint or bursal effusion Joint effusion:  No joint effusion within either hip. Bursae: Trace fluid within the right trochanteric bursa. No fluid within the left trochanteric bursa. Muscles and tendons Muscles and tendons: The origins of the bilateral rectus femoris tendons are intact. The bilateral common hamstring tendon origins are intact. The bilateral iliopsoas tendons are intact. There is edema and mild to moderate fluid at the deep aspect of the right gluteus medius muscle at the musculotendinous  junction (axial series 4, image 7), likely mild interstitial tears. The more distal right gluteus medius tendon insertion is intact. Minimal fluid anterior to the right gluteus minimus tendon insertion (axial images 12 through 16, series 4). The left gluteus minimus and medius tendon insertions are intact. There is moderate edema deep to the right iliotibial band and lateral to the right greater trochanter (axial series 4 images 20 through  26). Moderate partially visualized edema and swelling of the subcutaneous fat lateral to the right hip and proximal thigh. Moderate to high-grade edema with additional fluid bright signal within the right gluteus maximus muscle, likely interstitial tears. Note is made of heterogeneous density within the muscle in this region on today's CT. The coronal images on the current MR sequence image more of the proximal right thigh than the axial images, and there appears to be discontinuous decreased T1 and decreased T2 signal with blooming artifact suggesting calcification along partial borders of a heterogeneous collection in the region of the right gluteus maximus muscle and the adjacent long head and short head of the biceps femoris muscle. This presumably represents a hematoma with blood products of different ages. This is visualized extending at least 16 cm in craniocaudal dimension and extending off the inferior plane of view (coronal images 4 through 16). This is favored to represent interstitial muscle tears and hematomas of varying ages. Other findings Miscellaneous: There is high-grade distention of the urinary bladder without definite wall thickening. IMPRESSION: 1. No MRI evidence of an acute fracture, with attention to the right femoral neck questioned on CT. 2. Interstitial tears, edema, likely an acute on chronic hematoma within the right gluteus maximus muscle extending into the adjacent biceps femoris short head and long head muscles. There appears to be discontinuous  calcification along partial borders of this heterogeneous collection favored to represent an evolving hematoma with blood products of differing ages. This is visualized extending at least 16 cm in craniocaudal dimension and extending off the inferior plane of view. 3. Mild interstitial tears of the deep aspect of the right gluteus medius muscle at the musculotendinous junction. 4. Moderate edema deep to the right iliotibial band and lateral to the right greater trochanter. 5. Trace fluid within the right trochanteric bursa. 6. High-grade distention of the urinary bladder without definite wall thickening. Electronically Signed   By: Bertina Broccoli M.D.   On: 07/24/2023 11:10   MR LUMBAR SPINE WO CONTRAST Result Date: 07/24/2023 CLINICAL DATA:  Lumbar radiculopathy EXAM: MRI LUMBAR SPINE WITHOUT CONTRAST TECHNIQUE: Multiplanar, multisequence MR imaging of the lumbar spine was performed. No intravenous contrast was administered. COMPARISON:  None Available. FINDINGS: Segmentation:  Standard. Alignment:  Physiologic. Vertebrae: No acute fracture, evidence of discitis, or bone lesion. Minimal edematous signal at the anterior superior corner of T11, likely degenerative Conus medullaris and cauda equina: Conus extends to the T12 level. Conus and cauda equina appear normal. Paraspinal and other soft tissues: No perispinal mass or inflammation seen. There is edematous signal in the right gluteal musculature and subcutaneous fat, reference dedicated pelvis MRI. Cholelithiasis Disc levels: Slight disc desiccation and narrowing with bulge at L3-4 and L4-5. Negative facets. No neural impingement IMPRESSION: No acute finding or neural impingement in the lumbar spine. Electronically Signed   By: Ronnette Coke M.D.   On: 07/24/2023 10:27   CT HIP RIGHT WO CONTRAST Result Date: 07/24/2023 CLINICAL DATA:  Hip trauma. EXAM: CT OF THE RIGHT HIP WITHOUT CONTRAST TECHNIQUE: Multidetector CT imaging of the right hip was performed  according to the standard protocol. Multiplanar CT image reconstructions were also generated. RADIATION DOSE REDUCTION: This exam was performed according to the departmental dose-optimization program which includes automated exposure control, adjustment of the mA and/or kV according to patient size and/or use of iterative reconstruction technique. COMPARISON:  None FINDINGS: Bones/Joint/Cartilage There is very subtle cortical irregularity identified along the femoral neck, image 58/4. On the corresponding axial images  there is a thin sclerotic line which extends through the neck at this level. No other signs of acute bone abnormality identified. Ligaments Suboptimally assessed by CT. Muscles and Tendons There is abnormal asymmetric low-attenuation throughout the posterior compartment musculature with some areas of internal hyperattenuation. Soft tissues Posterior skin thickening and subcutaneous soft tissue edema overlying the posterior compartment musculature of the proximal thigh and gluteal region. IMPRESSION: 1. There is very subtle cortical irregularity identified along the femoral neck. On the corresponding axial images there is a thin sclerotic line which extends through the neck at this level. This is equivocal for a nondisplaced fracture. Consider further evaluation with MRI to assess for marrow edema. 2. Abnormal asymmetric low-attenuation throughout the posterior compartment musculature with some areas of internal hyperattenuation. Findings are favored to represent a combination of edema and hematoma. 3. Posterior skin thickening and subcutaneous soft tissue edema overlying the posterior compartment musculature of the proximal thigh and gluteal region. Electronically Signed   By: Kimberley Penman M.D.   On: 07/24/2023 07:03   DG Tibia/Fibula Right Result Date: 07/24/2023 CLINICAL DATA:  Bruising and pain after fall. EXAM: RIGHT TIBIA AND FIBULA - 2 VIEW COMPARISON:  None FINDINGS: There is no evidence of  fracture or other focal bone lesions. Mild diffuse soft tissue edema. IMPRESSION: 1. No acute bone abnormality. 2. Mild diffuse soft tissue edema. Electronically Signed   By: Kimberley Penman M.D.   On: 07/24/2023 06:51   DG Foot Complete Right Result Date: 07/24/2023 CLINICAL DATA:  Right ankle bruising.  Status post fall 3 days ago. EXAM: RIGHT FOOT COMPLETE - 3+ VIEW COMPARISON:  None Available. FINDINGS: There is no evidence of fracture or dislocation. There is no evidence of arthropathy or other focal bone abnormality. Soft tissues are unremarkable. IMPRESSION: Negative. Electronically Signed   By: Kimberley Penman M.D.   On: 07/24/2023 06:49   DG Pelvis 1-2 Views Result Date: 07/23/2023 CLINICAL DATA:  Hip pain EXAM: PELVIS - 1-2 VIEW COMPARISON:  None Available. FINDINGS: There is no evidence of pelvic fracture or diastasis. No pelvic bone lesions are seen. IMPRESSION: Negative. Electronically Signed   By: Fredrich Jefferson M.D.   On: 07/23/2023 18:07   DG Lumbar Spine Complete Result Date: 07/23/2023 CLINICAL DATA:  Pain. EXAM: LUMBAR SPINE - COMPLETE 4+ VIEW COMPARISON:  None Available. FINDINGS: There is no evidence of lumbar spine fracture. Alignment is normal. Intervertebral disc spaces are maintained. IMPRESSION: Negative. Electronically Signed   By: Tyron Gallon M.D.   On: 07/23/2023 17:39   DG Femur Min 2 Views Right Result Date: 07/23/2023 CLINICAL DATA:  Pain. EXAM: RIGHT FEMUR 2 VIEWS COMPARISON:  None Available. FINDINGS: There is no evidence of fracture or other focal bone lesions. There is soft tissue swelling overlying the greater trochanter. IMPRESSION: 1. No acute fracture or dislocation. 2. Soft tissue swelling overlying the greater trochanter. Electronically Signed   By: Tyron Gallon M.D.   On: 07/23/2023 17:20    Review of Systems  HENT:  Negative for ear discharge, ear pain, hearing loss and tinnitus.   Eyes:  Negative for photophobia and pain.  Respiratory:  Negative for  cough and shortness of breath.   Cardiovascular:  Negative for chest pain.  Gastrointestinal:  Negative for abdominal pain, nausea and vomiting.  Genitourinary:  Negative for dysuria, flank pain, frequency and urgency.  Musculoskeletal:  Positive for arthralgias (Right hip/leg). Negative for back pain, myalgias and neck pain.  Neurological:  Negative for dizziness and headaches.  Hematological:  Does not bruise/bleed easily.  Psychiatric/Behavioral:  The patient is not nervous/anxious.    Blood pressure 106/63, pulse 71, temperature 97.8 F (36.6 C), temperature source Oral, resp. rate 16, height 6\' 1"  (1.854 m), weight 79.4 kg, SpO2 100%. Physical Exam Constitutional:      General: He is not in acute distress.    Appearance: He is well-developed. He is not diaphoretic.  HENT:     Head: Normocephalic and atraumatic.  Eyes:     General: No scleral icterus.       Right eye: No discharge.        Left eye: No discharge.     Conjunctiva/sclera: Conjunctivae normal.  Cardiovascular:     Rate and Rhythm: Normal rate and regular rhythm.  Pulmonary:     Effort: Pulmonary effort is normal. No respiratory distress.  Musculoskeletal:     Cervical back: Normal range of motion.     Comments: RLE No traumatic wounds, extensive ecchymosis, lateral thigh. Mod TTP post thigh. Compartments soft.  No knee or ankle effusion  Knee stable to varus/ valgus and anterior/posterior stress  Sens DPN, SPN, TN intact  Motor EHL, ext, flex, evers 5/5  DP 2+, PT 2+, No significant edema  Skin:    General: Skin is warm and dry.  Neurological:     Mental Status: He is alert.  Psychiatric:        Mood and Affect: Mood normal.        Behavior: Behavior normal.     Assessment/Plan: Right gluteal et al muscle tears -- Plan initial non-operative treatment with potential operative repair depending on response to treatment. He may WBAT RLE and f/u with Dr. Hermina Loosen next week.    Georganna Kin,  PA-C Orthopedic Surgery [phone] 07/24/2023, 2:17 PM

## 2023-07-25 ENCOUNTER — Other Ambulatory Visit (HOSPITAL_COMMUNITY): Payer: Self-pay

## 2023-07-25 DIAGNOSIS — R296 Repeated falls: Secondary | ICD-10-CM | POA: Diagnosis not present

## 2023-07-25 DIAGNOSIS — S76091A Other specified injury of muscle, fascia and tendon of right hip, initial encounter: Secondary | ICD-10-CM | POA: Diagnosis not present

## 2023-07-25 LAB — CBC
HCT: 28 % — ABNORMAL LOW (ref 39.0–52.0)
HCT: 28.7 % — ABNORMAL LOW (ref 39.0–52.0)
Hemoglobin: 9.6 g/dL — ABNORMAL LOW (ref 13.0–17.0)
Hemoglobin: 9.6 g/dL — ABNORMAL LOW (ref 13.0–17.0)
MCH: 32.7 pg (ref 26.0–34.0)
MCH: 31.7 pg (ref 26.0–34.0)
MCHC: 34.3 g/dL (ref 30.0–36.0)
MCHC: 33.4 g/dL (ref 30.0–36.0)
MCV: 95.2 fL (ref 80.0–100.0)
MCV: 94.7 fL (ref 80.0–100.0)
Platelets: 288 10*3/uL (ref 150–400)
Platelets: 285 10*3/uL (ref 150–400)
RBC: 2.94 MIL/uL — ABNORMAL LOW (ref 4.22–5.81)
RBC: 3.03 MIL/uL — ABNORMAL LOW (ref 4.22–5.81)
RDW: 12.9 % (ref 11.5–15.5)
RDW: 12.9 % (ref 11.5–15.5)
WBC: 8.4 10*3/uL (ref 4.0–10.5)
WBC: 6.5 10*3/uL (ref 4.0–10.5)
nRBC: 0 % (ref 0.0–0.2)
nRBC: 0 % (ref 0.0–0.2)

## 2023-07-25 MED ORDER — OXYCODONE HCL 5 MG PO TABS
2.5000 mg | ORAL_TABLET | Freq: Four times a day (QID) | ORAL | 0 refills | Status: AC | PRN
Start: 2023-07-25 — End: ?
  Filled 2023-07-25: qty 20, 5d supply, fill #0

## 2023-07-25 MED ORDER — POLYETHYLENE GLYCOL 3350 17 G PO PACK: 17.0000 g | PACK | Freq: Every day | ORAL | Status: AC

## 2023-07-25 NOTE — Plan of Care (Signed)

## 2023-07-25 NOTE — Evaluation (Signed)
 Occupational Therapy Evaluation Patient Details Name: Jonathan Hopkins MRN: [id number] DOB: 1985/12/05 Today's Date: 07/25/2023   History of Present Illness   Patient is a 38 y/o male admitted 07/23/23 with R LE pain after recent increase in falls.  MRI positive for R gluteal muscle tears with plan for WBAT potential operative repair.  PMH positive for spinal cerebellar ataxia, ADHD, HTN, anxiety/MDD, insomina.     Clinical Impressions  Pt admitted based on above, and was seen based on problem list below. PTA pt was independent with ADLs and IADLs Today pt is requiring set up  to CGA for ADLs. Functional transfers completed with CGA and use of RW. Pt is able to complete ADLs without physical assistance, but increased time and effort. Pt is experiencing significant difficulty with bilateral tasks and coordination. Recommendation of neuro outpatient OT to improve strength and coordination. OT will continue to follow acutely to maximize functional independence.       If plan is discharge home, recommend the following:   A little help with walking and/or transfers;A little help with bathing/dressing/bathroom     Functional Status Assessment   Patient has had a recent decline in their functional status and demonstrates the ability to make significant improvements in function in a reasonable and predictable amount of time.     Equipment Recommendations   BSC/3in1     Recommendations for Other Services         Precautions/Restrictions   Precautions Precautions: Fall Restrictions Weight Bearing Restrictions Per Provider Order: Yes RLE Weight Bearing Per Provider Order: Weight bearing as tolerated     Mobility Bed Mobility Overal bed mobility: Modified Independent        Transfers Overall transfer level: Needs assistance Equipment used: Rolling walker (2 wheels) Transfers: Sit to/from Stand, Bed to chair/wheelchair/BSC Sit to Stand: Supervision     Step pivot  transfers: Contact guard assist     General transfer comment: CGA for use of RW      Balance Overall balance assessment: Needs assistance Sitting-balance support: No upper extremity supported, Feet supported Sitting balance-[name]: Good   Postural control: Posterior lean Standing balance support: No upper extremity supported Standing balance-[name]: Fair       ADL either performed or assessed with clinical judgement   ADL Overall ADL's : Needs assistance/impaired Eating/Feeding: Set up;Sitting   Grooming: Set up;Sitting       Upper Body Dressing : Set up;Sitting   Lower Body Dressing: Contact guard assist;Sit to/from stand   Toilet Transfer: Contact guard assist;Ambulation;Rolling walker (2 wheels) Toilet Transfer Details (indicate cue type and reason): Simulated in room         Functional mobility during ADLs: Contact guard assist;Rolling walker (2 wheels) General ADL Comments: Significantly increased time and effort to complete tasks     Vision Baseline Vision/History: 0 No visual deficits Vision Assessment?: No apparent visual deficits            Pertinent Vitals/Pain Pain Assessment Pain Assessment: Faces Faces Pain Scale: Hurts a little bit Pain Location: R buttocks Pain Descriptors / Indicators: Sore Pain Intervention(s): Monitored during session     Extremity/Trunk Assessment Upper Extremity Assessment Upper Extremity Assessment: Generalized weakness (Decreased Bil FMC and Bil coordination)   Lower Extremity Assessment Lower Extremity Assessment: Defer to PT evaluation   Cervical / Trunk Assessment Cervical / Trunk Assessment: Kyphotic   Communication Communication Communication: No apparent difficulties   Cognition Arousal: Alert Behavior During Therapy: WFL for tasks assessed/performed Cognition: No  apparent impairments     Following commands: Intact       Cueing  General Comments   Cueing Techniques: Verbal  cues  VSS on RA           Home Living Family/patient expects to be discharged to:: Private residence Living Arrangements: Other relatives (Sister and niece) Available Help at Discharge: Family;Available PRN/intermittently Type of Home: House Home Access: Stairs to enter [employer] of Steps: 1   Home Layout: One level     Bathroom Shower/Tub: Producer, television/film/video: Standard Bathroom Accessibility: Yes How Accessible: Accessible via walker Home Equipment: Rolling Walker (2 wheels);Cane - single point;Grab bars - tub/shower;Grab bars - toilet;Hand held shower head   Additional Comments: using cane at baseline, has falls regularly, but not as frequent as past 3 days      Prior Functioning/Environment Prior Level of Function : History of Falls (last six months)       Mobility Comments: reports 8 falls in past 3 days due to L leg giving out      OT Problem List: Decreased strength;Decreased range of motion;Decreased activity tolerance;Impaired balance (sitting and/or standing);Decreased coordination   OT Treatment/Interventions: Self-care/ADL training;Therapeutic exercise;DME and/or AE instruction;Therapeutic activities;Patient/family education;Balance training      OT Goals(Current goals can be found in the care plan section)   Acute Rehab OT Goals Patient Stated Goal: To go home OT Goal Formulation: With patient Time For Goal Achievement: 08/08/23 Potential to Achieve Goals: Good   OT Frequency:  Min 2X/week       AM-PAC OT "6 Clicks" Daily Activity     Outcome Measure Help from another person eating meals?: None Help from another person taking care of personal grooming?: A Little Help from another person toileting, which includes using toliet, bedpan, or urinal?: A Little Help from another person bathing (including washing, rinsing, drying)?: A Little Help from another person to put on and taking off regular upper body clothing?: A  Little Help from another person to put on and taking off regular lower body clothing?: A Little 6 Click Score: 19   End of Session Equipment Utilized During Treatment: Gait belt;Rolling walker (2 wheels) Nurse Communication: Mobility status  Activity Tolerance: Patient tolerated treatment well Patient left: in bed;with call bell/phone within reach  OT Visit Diagnosis: Unsteadiness on feet (R26.81);Other abnormalities of gait and mobility (R26.89);Repeated falls (R29.6);Muscle weakness (generalized) (M62.81);Ataxia, unspecified (R27.0)                Time: 4696-2952 OT Time Calculation (min): 18 min Charges:  OT General Charges $OT Visit: 1 Visit OT Evaluation $OT Eval Moderate Complexity: 1 Mod  Delmer Ferraris, OT  Acute Rehabilitation Services Office [phone] Secure chat preferred   Mickael Alamo 07/25/2023, 1:39 PM

## 2023-07-25 NOTE — Progress Notes (Signed)
 Reviewed AVS, patient expressed understanding of medications, MD follow up reviewed.   Patient states all belongings brought to the hospital at time of admission are accounted for and packed to take home.  Picked up medications from [facility] pharmacy. Pt transported to Discharge lounge to wait for transportation home.

## 2023-07-25 NOTE — Progress Notes (Signed)
 Physical Therapy Treatment Patient Details Name: Jonathan Hopkins MRN: [id number] DOB: Dec 24, 1985 Today's Date: 07/25/2023   History of Present Illness Patient is a 38 y/o male admitted 07/23/23 with R LE pain after recent increase in falls.  MRI positive for R gluteal muscle tears with plan for WBAT potential operative repair.  PMH positive for spinal cerebellar ataxia, ADHD, HTN, anxiety/MDD, insomina.    PT Comments  Pt endorses worse soreness of R glute this date, but is eager to d/c home. Pt ambulatory for good hallway distance, has baseline ataxia but PT encouraged increasing foot clearance R>L during swing phase of gait which is anticipated worse vs baseline given pain. Pt proficiently navigated one stair, which is what he has to do to enter home at d/c. Pt will have support of his family and friends at d/c, and pt agreeable to using RW at home until pain and mobility improves. Pt plans to d/c home today.     If plan is discharge home, recommend the following: Help with stairs or ramp for entrance;Assistance with cooking/housework   Can travel by private vehicle        Equipment Recommendations  None recommended by PT    Recommendations for Other Services       Precautions / Restrictions Precautions Precautions: Fall Restrictions Weight Bearing Restrictions Per Provider Order: Yes RLE Weight Bearing Per Provider Order: Weight bearing as tolerated     Mobility  Bed Mobility Overal bed mobility: Modified Independent                  Transfers Overall transfer level: Needs assistance Equipment used: Rolling walker (2 wheels) Transfers: Sit to/from Stand Sit to Stand: Supervision           General transfer comment: slow to rise, and poor eccentric control when going stand>sit which pt states is baseline    Ambulation/Gait Ambulation/Gait assistance: Supervision, Contact guard assist Gait Distance (Feet): 100 Feet Assistive device: Rolling walker (2  wheels) Gait Pattern/deviations: Step-to pattern, Step-through pattern, Decreased stride length, Antalgic, Shuffle Gait velocity: decr     General Gait Details: cues for upright posture, increasing foot clearance bilat R>L; increasingly antalgic gait secondary to R gluteal pain. baseline ataxia   Stairs Stairs: Yes Stairs assistance: Supervision Stair Management: One rail Left, Step to pattern, Forwards Number of Stairs: 1 General stair comments: cues for sequencing (up with the good leg, down with the bad leg)   Wheelchair Mobility     Tilt Bed    Modified Rankin (Stroke Patients Only)       Balance Overall balance assessment: Needs assistance Sitting-balance support: No upper extremity supported, Feet supported Sitting balance-[name]: Good   Postural control: Posterior lean Standing balance support: No upper extremity supported Standing balance-[name]: Fair                              Hotel manager: No apparent difficulties  Cognition Arousal: Alert Behavior During Therapy: WFL for tasks assessed/performed   PT - Cognitive impairments: No apparent impairments                         Following commands: Intact      Cueing Cueing Techniques: Verbal cues  Exercises      General Comments General comments (skin integrity, edema, etc.): VSS on RA      Pertinent Vitals/Pain Pain Assessment Pain Assessment: Faces Faces  Pain Scale: Hurts even more Pain Location: R buttocks Pain Descriptors / Indicators: Sore, Grimacing, Guarding Pain Intervention(s): Limited activity within patient's tolerance, Monitored during session, Repositioned    Home Living Family/patient expects to be discharged to:: Private residence Living Arrangements: Other relatives (Sister and niece) Available Help at Discharge: Family;Available PRN/intermittently Type of Home: House Home Access: Stairs to enter   Entrance  Stairs-Number of Steps: 1   Home Layout: One level Home Equipment: Agricultural consultant (2 wheels);Cane - single point;Grab bars - tub/shower;Grab bars - toilet;Hand held shower head Additional Comments: using cane at baseline, has falls regularly, but not as frequent as past 3 days    Prior Function            PT Goals (current goals can now be found in the care plan section) Acute Rehab PT Goals Patient Stated Goal: go home PT Goal Formulation: With patient Time For Goal Achievement: 08/07/23 Potential to Achieve Goals: Good Progress towards PT goals: Progressing toward goals    Frequency    Min 2X/week      PT Plan      Co-evaluation              AM-PAC PT "6 Clicks" Mobility   Outcome Measure  Help needed turning from your back to your side while in a flat bed without using bedrails?: None Help needed moving from lying on your back to sitting on the side of a flat bed without using bedrails?: None Help needed moving to and from a bed to a chair (including a wheelchair)?: A Little Help needed standing up from a chair using your arms (e.g., wheelchair or bedside chair)?: A Little Help needed to walk in hospital room?: A Little Help needed climbing 3-5 steps with a railing? : A Little 6 Click Score: 20    End of Session Equipment Utilized During Treatment: Gait belt Activity Tolerance: Patient limited by pain Patient left: in bed;with call bell/phone within reach;with bed alarm set   PT Visit Diagnosis: Other abnormalities of gait and mobility (R26.89);Repeated falls (R29.6);Muscle weakness (generalized) (M62.81);Pain Pain - Right/Left: Right Pain - part of body: Hip     Time: 1225-1238 PT Time Calculation (min) (ACUTE ONLY): 13 min  Charges:    $Gait Training: 8-22 mins PT General Charges $$ ACUTE PT VISIT: 1 Visit                     Shirlene Doughty, PT DPT Acute Rehabilitation Services Secure Chat Preferred  Office [phone]    Akaila Rambo Cydney Draft 07/25/2023, 2:48 PM

## 2023-07-25 NOTE — Assessment & Plan Note (Deleted)
 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

## 2023-07-25 NOTE — Plan of Care (Signed)
 Problem: Education: Goal: Knowledge of General Education information will improve Description: Including pain rating scale, medication(s)/side effects and non-pharmacologic comfort measures 07/25/2023 1316 by Masey Scheiber K, RN Outcome: Adequate for Discharge 07/25/2023 1316 by Callyn Severtson K, RN Outcome: Progressing   Problem: Health Behavior/Discharge Planning: Goal: Ability to manage health-related needs will improve 07/25/2023 1316 by Fariha Goto K, RN Outcome: Adequate for Discharge 07/25/2023 1316 by Laakea Pereira K, RN Outcome: Progressing   Problem: Clinical Measurements: Goal: Ability to maintain clinical measurements within normal limits will improve 07/25/2023 1316 by Marybella Ethier K, RN Outcome: Adequate for Discharge 07/25/2023 1316 by Eleonore Grill, RN Outcome: Progressing Goal: Will remain free from infection 07/25/2023 1316 by Brentton Wardlow K, RN Outcome: Adequate for Discharge 07/25/2023 1316 by Zaleigh Bermingham K, RN Outcome: Progressing Goal: Diagnostic test results will improve 07/25/2023 1316 by Zykera Abella K, RN Outcome: Adequate for Discharge 07/25/2023 1316 by Eleonore Grill, RN Outcome: Progressing Goal: Respiratory complications will improve 07/25/2023 1316 by Dilan Novosad K, RN Outcome: Adequate for Discharge 07/25/2023 1316 by Keygan Dumond K, RN Outcome: Progressing Goal: Cardiovascular complication will be avoided 07/25/2023 1316 by Terria Deschepper K, RN Outcome: Adequate for Discharge 07/25/2023 1316 by Tahjir Silveria K, RN Outcome: Progressing   Problem: Activity: Goal: Risk for activity intolerance will decrease 07/25/2023 1316 by Maxen Rowland K, RN Outcome: Adequate for Discharge 07/25/2023 1316 by Eleonore Grill, RN Outcome: Progressing   Problem: Nutrition: Goal: Adequate nutrition will be maintained 07/25/2023 1316 by Donnell Wion K, RN Outcome: Adequate for Discharge 07/25/2023 1316 by Eleonore Grill, RN Outcome:  Progressing   Problem: Coping: Goal: Level of anxiety will decrease 07/25/2023 1316 by Bushra Denman K, RN Outcome: Adequate for Discharge 07/25/2023 1316 by Eleonore Grill, RN Outcome: Progressing   Problem: Elimination: Goal: Will not experience complications related to bowel motility 07/25/2023 1316 by Aundray Cartlidge K, RN Outcome: Adequate for Discharge 07/25/2023 1316 by Eleonore Grill, RN Outcome: Progressing Goal: Will not experience complications related to urinary retention 07/25/2023 1316 by Lavergne Hiltunen K, RN Outcome: Adequate for Discharge 07/25/2023 1316 by Eleonore Grill, RN Outcome: Progressing   Problem: Pain Managment: Goal: General experience of comfort will improve and/or be controlled 07/25/2023 1316 by Mishka Stegemann K, RN Outcome: Adequate for Discharge 07/25/2023 1316 by Eleonore Grill, RN Outcome: Progressing   Problem: Safety: Goal: Ability to remain free from injury will improve 07/25/2023 1316 by Edison Wollschlager K, RN Outcome: Adequate for Discharge 07/25/2023 1316 by Eleonore Grill, RN Outcome: Progressing   Problem: Skin Integrity: Goal: Risk for impaired skin integrity will decrease 07/25/2023 1316 by Kepler Mccabe K, RN Outcome: Adequate for Discharge 07/25/2023 1316 by Tonny Isensee K, RN Outcome: Progressing   Problem: Education: Goal: Knowledge of General Education information will improve Description: Including pain rating scale, medication(s)/side effects and non-pharmacologic comfort measures 07/25/2023 1316 by Teren Franckowiak K, RN Outcome: Adequate for Discharge 07/25/2023 1316 by Shanyla Marconi K, RN Outcome: Progressing   Problem: Health Behavior/Discharge Planning: Goal: Ability to manage health-related needs will improve 07/25/2023 1316 by Oswald Pott K, RN Outcome: Adequate for Discharge 07/25/2023 1316 by Landin Tallon K, RN Outcome: Progressing   Problem: Clinical Measurements: Goal: Ability to maintain clinical  measurements within normal limits will improve 07/25/2023 1316 by Tilla Wilborn K, RN Outcome: Adequate for Discharge 07/25/2023 1316 by Axyl Sitzman K, RN Outcome: Progressing Goal: Will remain free from infection 07/25/2023 1316 by Mischelle Reeg K, RN Outcome: Adequate for Discharge  07/25/2023 1316 by Zariyah Stephens K, RN Outcome: Progressing Goal: Diagnostic test results will improve 07/25/2023 1316 by Tanesha Arambula K, RN Outcome: Adequate for Discharge 07/25/2023 1316 by Karishma Unrein K, RN Outcome: Progressing Goal: Respiratory complications will improve 07/25/2023 1316 by Merica Prell K, RN Outcome: Adequate for Discharge 07/25/2023 1316 by Haani Bakula K, RN Outcome: Progressing Goal: Cardiovascular complication will be avoided 07/25/2023 1316 by Jahmar Mckelvy K, RN Outcome: Adequate for Discharge 07/25/2023 1316 by Venna Berberich K, RN Outcome: Progressing   Problem: Activity: Goal: Risk for activity intolerance will decrease 07/25/2023 1316 by Estelene Carmack K, RN Outcome: Adequate for Discharge 07/25/2023 1316 by Eleonore Grill, RN Outcome: Progressing   Problem: Nutrition: Goal: Adequate nutrition will be maintained 07/25/2023 1316 by Kesleigh Morson K, RN Outcome: Adequate for Discharge 07/25/2023 1316 by Eleonore Grill, RN Outcome: Progressing   Problem: Coping: Goal: Level of anxiety will decrease 07/25/2023 1316 by Malia Corsi K, RN Outcome: Adequate for Discharge 07/25/2023 1316 by Eleonore Grill, RN Outcome: Progressing   Problem: Elimination: Goal: Will not experience complications related to bowel motility 07/25/2023 1316 by Shakinah Navis K, RN Outcome: Adequate for Discharge 07/25/2023 1316 by Eleonore Grill, RN Outcome: Progressing Goal: Will not experience complications related to urinary retention 07/25/2023 1316 by Sahara Fujimoto K, RN Outcome: Adequate for Discharge 07/25/2023 1316 by Eleonore Grill, RN Outcome: Progressing    Problem: Pain Managment: Goal: General experience of comfort will improve and/or be controlled 07/25/2023 1316 by Benaiah Behan K, RN Outcome: Adequate for Discharge 07/25/2023 1316 by Eleonore Grill, RN Outcome: Progressing   Problem: Safety: Goal: Ability to remain free from injury will improve 07/25/2023 1316 by Lora Chavers K, RN Outcome: Adequate for Discharge 07/25/2023 1316 by Eleonore Grill, RN Outcome: Progressing   Problem: Skin Integrity: Goal: Risk for impaired skin integrity will decrease 07/25/2023 1316 by Glynn Freas K, RN Outcome: Adequate for Discharge 07/25/2023 1316 by Ricci Dirocco K, RN Outcome: Progressing

## 2023-07-25 NOTE — Discharge Instructions (Addendum)
 Dear Landry Pinks,   Thank you so much for allowing us  to be part of your care!  You were admitted to [facility] after frequent falls and found to have a tear in your gluteus muscle.  The Orthopedic specialists saw you and did not think you required surgery at this time. You were given pain medications to help with your symptoms and will follow up with Ortho next week.  POST-HOSPITAL & CARE INSTRUCTIONS Pause taking your blood pressure medications until you see your PCP.  Follow up with the Orthopedic team next week. Please call [phone] to schedule an appointment with Dr Hermina Loosen. Their office location is: [facility] [address] [place], [place] [address] [phone] Let PCP/Specialists know of any changes that were made during your hospitalization.  Please see medications section of this packet for any medication changes.    RETURN PRECAUTIONS: - Repeated falls - Inability to walk   Take care and be well!  Family Medicine Teaching Service  [facility]  [facility]  [address] [place], [place] [address] [phone]

## 2023-07-25 NOTE — TOC CM/SW Note (Signed)
 Patient confined to a room therefore needs a bedside commode

## 2023-07-25 NOTE — Discharge Summary (Signed)
 Family Medicine Teaching [facility] Discharge Summary  Patient name: Jonathan Hopkins Medical record number: [id number] Date of birth: [birth year] Age: 38 y.o. Gender: male Date of Admission: 07/23/2023  Date of Discharge: 07/25/23    Admitting Physician: Genora Kidd, MD  Primary Care Provider: Fonda Hymen, NP Consultants: Orthopedics  Indication for Hospitalization: Frequent falls  Discharge Diagnoses/Problem List:  Principal Problem for Admission: R gluteal tears Other Problems addressed during stay:  Principal Problem:   Frequent falls  R Gluteal Tears Active Problems:   Abnormality of femur   Anemia   Chronic health problem  Brief Hospital Course:  Jonathan Hopkins is a 38 y.o.male with a history of SCA3, ADHD, OAB, HTN who was admitted to the [facility] Medicine Teaching Service at [facility] for frequent falls and gluteal tears. His hospital course is detailed below:  R gluteal muscle tears  Frequent Falls Presented for worsening falls, inability to ambulate. R hip MRI with multiple gluteal tears, no fractures. Ortho was consulted and did not recommend surgical intervention. Patient symptomatically managed with pain medications. PT evaluated patient and recommended outpatient PT. Patient to follow with Ortho outpatient a week after discharge.   Other chronic conditions were medically managed with home medications and formulary alternatives as necessary (SCA3, ADHD, OAB, HTN, Anxiety/MDD)  PCP Follow-up Recommendations: BP meds held at discharge given normotension during admission. Reassess for resuming meds.  High grade distention of bladder noted on MRI Hip. Normal voids during admission. Follow up for any urinary retention symptoms.   Disposition: Home  Discharge Condition: Stable  Discharge Exam:  Vitals:   07/25/23 0451 07/25/23 0752  BP: 110/70 125/81  Pulse: 90 81  Resp: 16 17  Temp: 98.2 F (36.8 C) 97.7 F (36.5 C)  SpO2: 100% 100%    General: No acute distress. Resting comfortably in room. CV: Normal S1/S2. No extra heart sounds. Warm and well-perfused. Pulm: Breathing comfortably on room air. CTAB anteriorly. No increased WOB. Abd: Soft, non-tender, non-distended. Skin:  Warm, dry. Stable, diffuse bruising of L leg.  Ext: BLE sensation intact. Able to spontaneously move all extremities. Psych: Pleasant and appropriate.   Significant Procedures: N/a  Significant Labs and Imaging:  Recent Labs  Lab 07/24/23 1126 07/25/23 0352 07/25/23 1025  WBC 8.4 8.4 6.5  HGB 10.1* 9.6* 9.6*  HCT 29.9* 28.0* 28.7*  PLT 275 288 285   Recent Labs  Lab 07/23/23 1629  NA 136  K 4.3  CL 102  CO2 24  GLUCOSE 104*  BUN 15  CREATININE 0.94  CALCIUM 8.9   MR HIP RIGHT WO CONTRAST Result Date: 07/24/2023 CLINICAL DATA:  Right leg swelling and pain from multiple falls over last several weeks. Pain at right greater trochanter. Pre-existing gait abnormality. EXAM: MR OF THE RIGHT HIP WITHOUT CONTRAST TECHNIQUE: Multiplanar, multisequence MR imaging was performed. No intravenous contrast was administered. COMPARISON:  CT abdomen and pelvis 12/05/2021, AP pelvis and right femur radiographs 07/23/2023,, CT right hip 07/24/2023 FINDINGS: Bones: There is no marrow edema within the right femoral neck in the region of the questioned cortical irregularity and superior sclerotic line on today's CT. No MRI evidence of an acute fracture. Minimal superior pubic symphysis joint space narrowing. Articular cartilage and labrum Right hip: Articular cartilage: There is again a degenerative cyst measuring up to approximately 12 mm at the anterior superior right femoral head-neck junction. Mild thinning of the anterior superior right femoroacetabular cartilage. Labrum: No right hip joint effusion. Lack of intra-articular fluid limits  evaluation for the right acetabular labral tear. No definite labral tear is seen. On large field-of-view images, mild  superior left acetabular cartilage thinning. Mild superolateral left acetabular subchondral degenerative cystic change. Joint or bursal effusion Joint effusion:  No joint effusion within either hip. Bursae: Trace fluid within the right trochanteric bursa. No fluid within the left trochanteric bursa. Muscles and tendons Muscles and tendons: The origins of the bilateral rectus femoris tendons are intact. The bilateral common hamstring tendon origins are intact. The bilateral iliopsoas tendons are intact. There is edema and mild to moderate fluid at the deep aspect of the right gluteus medius muscle at the musculotendinous junction (axial series 4, image 7), likely mild interstitial tears. The more distal right gluteus medius tendon insertion is intact. Minimal fluid anterior to the right gluteus minimus tendon insertion (axial images 12 through 16, series 4). The left gluteus minimus and medius tendon insertions are intact. There is moderate edema deep to the right iliotibial band and lateral to the right greater trochanter (axial series 4 images 20 through 26). Moderate partially visualized edema and swelling of the subcutaneous fat lateral to the right hip and proximal thigh. Moderate to high-grade edema with additional fluid bright signal within the right gluteus maximus muscle, likely interstitial tears. Note is made of heterogeneous density within the muscle in this region on today's CT. The coronal images on the current MR sequence image more of the proximal right thigh than the axial images, and there appears to be discontinuous decreased T1 and decreased T2 signal with blooming artifact suggesting calcification along partial borders of a heterogeneous collection in the region of the right gluteus maximus muscle and the adjacent long head and short head of the biceps femoris muscle. This presumably represents a hematoma with blood products of different ages. This is visualized extending at least 16 cm in  craniocaudal dimension and extending off the inferior plane of view (coronal images 4 through 16). This is favored to represent interstitial muscle tears and hematomas of varying ages. Other findings Miscellaneous: There is high-grade distention of the urinary bladder without definite wall thickening. IMPRESSION: 1. No MRI evidence of an acute fracture, with attention to the right femoral neck questioned on CT. 2. Interstitial tears, edema, likely an acute on chronic hematoma within the right gluteus maximus muscle extending into the adjacent biceps femoris short head and long head muscles. There appears to be discontinuous calcification along partial borders of this heterogeneous collection favored to represent an evolving hematoma with blood products of differing ages. This is visualized extending at least 16 cm in craniocaudal dimension and extending off the inferior plane of view. 3. Mild interstitial tears of the deep aspect of the right gluteus medius muscle at the musculotendinous junction. 4. Moderate edema deep to the right iliotibial band and lateral to the right greater trochanter. 5. Trace fluid within the right trochanteric bursa. 6. High-grade distention of the urinary bladder without definite wall thickening. Electronically Signed   By: Bertina Broccoli M.D.   On: 07/24/2023 11:10   MR LUMBAR SPINE WO CONTRAST Result Date: 07/24/2023 CLINICAL DATA:  Lumbar radiculopathy EXAM: MRI LUMBAR SPINE WITHOUT CONTRAST TECHNIQUE: Multiplanar, multisequence MR imaging of the lumbar spine was performed. No intravenous contrast was administered. COMPARISON:  None Available. FINDINGS: Segmentation:  Standard. Alignment:  Physiologic. Vertebrae: No acute fracture, evidence of discitis, or bone lesion. Minimal edematous signal at the anterior superior corner of T11, likely degenerative Conus medullaris and cauda equina: Conus extends to the T12  level. Conus and cauda equina appear normal. Paraspinal and other soft  tissues: No perispinal mass or inflammation seen. There is edematous signal in the right gluteal musculature and subcutaneous fat, reference dedicated pelvis MRI. Cholelithiasis Disc levels: Slight disc desiccation and narrowing with bulge at L3-4 and L4-5. Negative facets. No neural impingement IMPRESSION: No acute finding or neural impingement in the lumbar spine. Electronically Signed   By: Ronnette Coke M.D.   On: 07/24/2023 10:27   CT HIP RIGHT WO CONTRAST Result Date: 07/24/2023 CLINICAL DATA:  Hip trauma. EXAM: CT OF THE RIGHT HIP WITHOUT CONTRAST TECHNIQUE: Multidetector CT imaging of the right hip was performed according to the standard protocol. Multiplanar CT image reconstructions were also generated. RADIATION DOSE REDUCTION: This exam was performed according to the departmental dose-optimization program which includes automated exposure control, adjustment of the mA and/or kV according to patient size and/or use of iterative reconstruction technique. COMPARISON:  None FINDINGS: Bones/Joint/Cartilage There is very subtle cortical irregularity identified along the femoral neck, image 58/4. On the corresponding axial images there is a thin sclerotic line which extends through the neck at this level. No other signs of acute bone abnormality identified. Ligaments Suboptimally assessed by CT. Muscles and Tendons There is abnormal asymmetric low-attenuation throughout the posterior compartment musculature with some areas of internal hyperattenuation. Soft tissues Posterior skin thickening and subcutaneous soft tissue edema overlying the posterior compartment musculature of the proximal thigh and gluteal region. IMPRESSION: 1. There is very subtle cortical irregularity identified along the femoral neck. On the corresponding axial images there is a thin sclerotic line which extends through the neck at this level. This is equivocal for a nondisplaced fracture. Consider further evaluation with MRI to assess  for marrow edema. 2. Abnormal asymmetric low-attenuation throughout the posterior compartment musculature with some areas of internal hyperattenuation. Findings are favored to represent a combination of edema and hematoma. 3. Posterior skin thickening and subcutaneous soft tissue edema overlying the posterior compartment musculature of the proximal thigh and gluteal region. Electronically Signed   By: Kimberley Penman M.D.   On: 07/24/2023 07:03   DG Tibia/Fibula Right Result Date: 07/24/2023 CLINICAL DATA:  Bruising and pain after fall. EXAM: RIGHT TIBIA AND FIBULA - 2 VIEW COMPARISON:  None FINDINGS: There is no evidence of fracture or other focal bone lesions. Mild diffuse soft tissue edema. IMPRESSION: 1. No acute bone abnormality. 2. Mild diffuse soft tissue edema. Electronically Signed   By: Kimberley Penman M.D.   On: 07/24/2023 06:51   DG Foot Complete Right Result Date: 07/24/2023 CLINICAL DATA:  Right ankle bruising.  Status post fall 3 days ago. EXAM: RIGHT FOOT COMPLETE - 3+ VIEW COMPARISON:  None Available. FINDINGS: There is no evidence of fracture or dislocation. There is no evidence of arthropathy or other focal bone abnormality. Soft tissues are unremarkable. IMPRESSION: Negative. Electronically Signed   By: Kimberley Penman M.D.   On: 07/24/2023 06:49   DG Pelvis 1-2 Views Result Date: 07/23/2023 CLINICAL DATA:  Hip pain EXAM: PELVIS - 1-2 VIEW COMPARISON:  None Available. FINDINGS: There is no evidence of pelvic fracture or diastasis. No pelvic bone lesions are seen. IMPRESSION: Negative. Electronically Signed   By: Fredrich Jefferson M.D.   On: 07/23/2023 18:07   DG Lumbar Spine Complete Result Date: 07/23/2023 CLINICAL DATA:  Pain. EXAM: LUMBAR SPINE - COMPLETE 4+ VIEW COMPARISON:  None Available. FINDINGS: There is no evidence of lumbar spine fracture. Alignment is normal. Intervertebral disc spaces are maintained. IMPRESSION:  Negative. Electronically Signed   By: Tyron Gallon M.D.   On:  07/23/2023 17:39   DG Femur Min 2 Views Right Result Date: 07/23/2023 CLINICAL DATA:  Pain. EXAM: RIGHT FEMUR 2 VIEWS COMPARISON:  None Available. FINDINGS: There is no evidence of fracture or other focal bone lesions. There is soft tissue swelling overlying the greater trochanter. IMPRESSION: 1. No acute fracture or dislocation. 2. Soft tissue swelling overlying the greater trochanter. Electronically Signed   By: Tyron Gallon M.D.   On: 07/23/2023 17:20     Results/Tests Pending at Time of Discharge: N/a Unresulted Labs (From admission, onward)    None      Discharge Medications:  Allergies as of 07/25/2023       Reactions   Amoxicillin Swelling        Medication List     PAUSE taking these medications    amLODipine 10 MG tablet Wait to take this until your doctor or other care provider tells you to start again. Commonly known as: NORVASC Take 10 mg by mouth daily.   losartan 25 MG tablet Wait to take this until your doctor or other care provider tells you to start again. Commonly known as: COZAAR Take 25 mg by mouth daily.       TAKE these medications    acetaminophen  500 MG tablet Commonly known as: TYLENOL  Take 2 tablets (1,000 mg total) by mouth every 6 (six) hours as needed. What changed: reasons to take this   amitriptyline  50 MG tablet Commonly known as: ELAVIL  Take 50 mg by mouth at bedtime.   buPROPion  300 MG 24 hr tablet Commonly known as: WELLBUTRIN  XL Take 300 mg by mouth every morning.   gabapentin  300 MG capsule Commonly known as: NEURONTIN  Take 1 capsule (300 mg total) by mouth at bedtime.   MULTI COMPLETE PO Take 1 capsule by mouth daily.   OMEGA 3 PO Take 1 capsule by mouth daily.   oxybutynin  5 MG 24 hr tablet Commonly known as: DITROPAN -XL Take 5 mg by mouth daily.   oxyCODONE  5 MG immediate release tablet Commonly known as: Oxy IR/ROXICODONE  Take 0.5-1 tablets (2.5-5 mg total) by mouth every 6 (six) hours as needed for severe  pain (pain score 7-10).   polyethylene glycol 17 g packet Commonly known as: MiraLax Take 17 g by mouth daily.               Durable Medical Equipment  (From admission, onward)           Start     Ordered   07/25/23 1309  For home use only DME 3 n 1  Once        07/25/23 1308            Discharge Instructions: Please refer to Patient Instructions section of EMR for full details.  Patient was counseled important signs and symptoms that should prompt return to medical care, changes in medications, dietary instructions, activity restrictions, and follow up appointments.   Follow-Up Appointments:  Follow-up Information      [facility] Follow up.   Specialty: Rehabilitation Contact information: [address] [place], [address] [place]  [address] [phone]                Carey Chapman, MD 07/25/2023, 2:11 PM PGY-1, [facility] Family Medicine

## 2023-07-25 NOTE — Assessment & Plan Note (Deleted)
 CT R Hip with subtle cortical irregularity identified along the femoral neck. Per secure chat with orthopedics Starr Eddy) - need to wait for formal MRI read however does not look acutely like a fx but has something in his post musculature on the T2-weighted images. Compartments soft but significant bruising.  - F/u MRI Hip and Lumbar Spine Read - F/u Orthopedics recs

## 2023-07-25 NOTE — Hospital Course (Addendum)
 Jonathan Hopkins is a 38 y.o.male with a history of SCA3, ADHD, OAB, HTN who was admitted to the [facility] Medicine Teaching Service at [facility] for frequent falls and gluteal tears. His hospital course is detailed below:  R gluteal muscle tears  Frequent Falls Presented for worsening falls, inability to ambulate. R hip MRI with multiple gluteal tears, no fractures. Ortho was consulted and did not recommend surgical intervention. Patient symptomatically managed with pain medications. PT evaluated patient and recommended outpatient PT. Patient to follow with Ortho outpatient a week after discharge.   Other chronic conditions were medically managed with home medications and formulary alternatives as necessary (SCA3, ADHD, OAB, HTN, Anxiety/MDD)  PCP Follow-up Recommendations: BP meds held at discharge given normotension during admission. Reassess for resuming meds.  High grade distention of bladder noted on MRI Hip. Normal voids during admission. Follow up for any urinary retention symptoms.

## 2023-07-25 NOTE — Assessment & Plan Note (Deleted)
 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 - Admit to FMTS, med surg, attending Dr. Drue Gerald - Orthopedics consulted by EDP, appreciate recommendations - Scheduled tylenol  650 mg q6h - Oxy 5 mg q6h PRN for severe pain - PT/OT - Fall precautions - F/u MRI Hip and Lumbar Spine read - AM CBC

## 2023-07-25 NOTE — TOC Initial Note (Addendum)
 Transition of Care (TOC) - Initial/Assessment Note   Patient from home with sister and niece   Has walker and cane.   PT recommendation for OP PT. Patient in agreement , offered choice. Patient prefers [facility] at [place] . Entered order, secure chatted MD to sign. Information placed on AVS.   Confirmed face sheet information with patient   OT just saw patient rec 3 in 1 and OP OT. Orders and progress note entered asked MD to sign. Called for [employer]  Patient Details  Name: Jonathan Hopkins MRN: [id number] Date of Birth: 09/25/1985  Transition of Care [facility]) CM/SW Contact:    Terre Ferri, RN Phone Number: 07/25/2023, 11:19 AM  Clinical Narrative:                   Expected Discharge Plan: Home/Self Care Barriers to Discharge: Continued Medical Work up   Patient Goals and CMS Choice Patient states their goals for this hospitalization and ongoing recovery are:: to return to home          Expected Discharge Plan and Services   Discharge Planning Services: CM Consult Post Acute Care Choice:  (OP PT)                   DME Arranged: N/A DME Agency: NA       HH Arranged: NA          Prior Living Arrangements/Services   Lives with:: Siblings Patient language and need for interpreter reviewed:: Yes Do you feel safe going back to the place where you live?: Yes      Need for Family Participation in Patient Care: Yes (Comment) Care giver support system in place?: Yes (comment) Current home services: DME Criminal Activity/Legal Involvement Pertinent to Current Situation/Hospitalization: No - Comment as needed  Activities of Daily Living      Permission Sought/Granted   Permission granted to share information with : No              Emotional Assessment Appearance:: Appears stated age Attitude/Demeanor/Rapport: Engaged Affect (typically observed): Appropriate Orientation: : Oriented to Self, Oriented to Place, Oriented to  Time, Oriented to  Situation Alcohol / Substance Use: Not Applicable Psych Involvement: No (comment)  Admission diagnosis:  Frequent falls [R29.6] Patient Active Problem List   Diagnosis Date Noted   Frequent falls  Inability to Ambulate 07/24/2023   Abnormality of femur 07/24/2023   Anemia 07/24/2023   Chronic health problem 07/24/2023   Appendicitis 12/05/2021   Gait abnormality 01/19/2019   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   PCP:  Fonda Hymen, NP Pharmacy:   [id number] [place], [place] - [address] [place] [address] Phone: [phone] Fax: [phone]     Social Drivers of Health (SDOH) Social History: SDOH Screenings   Food Insecurity: No Food Insecurity (04/16/2023)   Received from [facility]  Housing: Low Risk  (04/16/2023)   Received from [facility]  Transportation Needs: No Transportation Needs (04/16/2023)   Received from [facility]  Utilities: Not At Risk (04/16/2023)   Received from [facility]  Financial Resource Strain: Low Risk  (04/16/2023)   Received from [facility]  Physical Activity: Sufficiently Active (06/12/2022)   Received from [facility]  Social Connections: Socially Integrated (06/12/2022)   Received from [facility]  Stress: Stress Concern Present (06/12/2022)   Received from [facility]  Tobacco Use: Medium Risk (07/23/2023)   SDOH Interventions:  Readmission Risk Interventions     No data to display

## 2023-07-25 NOTE — Assessment & Plan Note (Deleted)
 Baseline normal around 14, here down to 10.6 normocytic. Extensive brusing +/- hematoma in posterior thigh likely source vs possible R femoral neck fracture. - CBC - Start heparin VTE ppx pending results

## 2023-08-12 ENCOUNTER — Encounter: Payer: Self-pay | Admitting: Physical Therapy

## 2023-08-12 ENCOUNTER — Other Ambulatory Visit: Payer: Self-pay

## 2023-08-12 ENCOUNTER — Ambulatory Visit: Admitting: Physical Therapy

## 2023-08-12 ENCOUNTER — Ambulatory Visit: Attending: Family Medicine | Admitting: Occupational Therapy

## 2023-08-12 DIAGNOSIS — R41842 Visuospatial deficit: Secondary | ICD-10-CM | POA: Insufficient documentation

## 2023-08-12 DIAGNOSIS — M6281 Muscle weakness (generalized): Secondary | ICD-10-CM | POA: Insufficient documentation

## 2023-08-12 DIAGNOSIS — R270 Ataxia, unspecified: Secondary | ICD-10-CM | POA: Insufficient documentation

## 2023-08-12 DIAGNOSIS — R2681 Unsteadiness on feet: Secondary | ICD-10-CM | POA: Insufficient documentation

## 2023-08-12 DIAGNOSIS — R293 Abnormal posture: Secondary | ICD-10-CM | POA: Insufficient documentation

## 2023-08-12 DIAGNOSIS — R2689 Other abnormalities of gait and mobility: Secondary | ICD-10-CM | POA: Insufficient documentation

## 2023-08-12 DIAGNOSIS — R208 Other disturbances of skin sensation: Secondary | ICD-10-CM | POA: Insufficient documentation

## 2023-08-12 DIAGNOSIS — R278 Other lack of coordination: Secondary | ICD-10-CM | POA: Insufficient documentation

## 2023-08-12 DIAGNOSIS — M7989 Other specified soft tissue disorders: Secondary | ICD-10-CM | POA: Insufficient documentation

## 2023-08-12 NOTE — Therapy (Signed)
 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   Past Surgical History:  Procedure Laterality Date   LAPAROSCOPIC APPENDECTOMY N/A 12/06/2021   Procedure: APPENDECTOMY LAPAROSCOPIC;  Surgeon: Oralee Billow, MD;  Location: [facility];  Service: General;  Laterality: N/A;   THYROID  SURGERY     WISDOM TOOTH EXTRACTION     x4   Patient Active Problem List   Diagnosis Date Noted   Frequent falls  R Gluteal Tears 07/24/2023   Abnormality of femur 07/24/2023   Anemia 07/24/2023   Chronic health problem 07/24/2023   Appendicitis 12/05/2021   Gait abnormality 01/19/2019   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  Pt reports sometimes that he feels like he gets food stuck when he swallows food or drinks.   Pt accompanied by: self   PERTINENT HISTORY: PMH positive for spinal cerebellar ataxia, ADHD, HTN, anxiety/MDD, insomina.38  y/o male admitted to [facility] 07/23/23 with R LE pain after recent increase in falls. MRI positive for R gluteal muscle tears with plan for WBAT potential operative repair, d/c with recommendation to f/u with orthopedics in 1 week.  PRECAUTIONS: Fall  WEIGHT BEARING RESTRICTIONS: No, WBAT  PAIN:  Are you having pain? Yes: NPRS scale: 6/10 Pain location: R glute and R knee Pain description: sharp Aggravating factors: extending R leg and prolonged standing Relieving factors: sitting, bending knee  FALLS: Has patient fallen in last 6 months? Yes. Number of falls reports falling multiple times per day  LIVING ENVIRONMENT: Lives with: lives with their family (sister and niece) Lives in: House/apartment Stairs: 1 step into home  Has following equipment at home: Otho Blitz - 2 wheeled, Environmental consultant - 4 wheeled, shower chair, Grab bars, and non skid floor in shower  PLOF: Independent and Independent with basic ADLs  PATIENT GOALS: [facility] skills to open food containers and separate coffee filters, button pants  OBJECTIVE:  Note: Objective measures were completed at Evaluation unless otherwise noted.  HAND DOMINANCE: Right  ADLs: Transfers/ambulation related to ADLs: Mod I with RW, reports frequent falls Eating: difficulty with cutting and scooping foods,  Grooming: decreased motor control when brushing teeth and shaving UB Dressing: Mod I, in sitting LB Dressing: difficulty with buttoning pants, belt, tying shoes (tends to just slip shoes on/off) Toileting: Mod I - has a grab bar next to toilet  Bathing: Mod I - using shower chair and grab bars Tub Shower transfers: Mod I - using grab bars Equipment: Shower seat with back, Grab bars, and Walk in shower  IADLs: Light housekeeping: "challenging" as he was using a cane before recent hospitalization Meal Prep: uses microwave and makes smaller meals Community mobility: driving Medication management: Mod I - utilizing pill box Handwriting: 80% legibility;  pt writing (wh[place] live [place]) in 30.85 seconds with large sizing and no regard to lined paper, some letters way above and some below lines.  MOBILITY STATUS: Needs Assist: CGA to Min A with ambulation from treatment room to front of clinic due to 2 LOB and Hx of falls  POSTURE COMMENTS:  rounded shoulders and forward head  ACTIVITY TOLERANCE: Activity tolerance: WFL for tasks assessed on eval  FUNCTIONAL OUTCOME MEASURES: PSFS  UPPER EXTREMITY ROM:  WFL bilaterally, pt with mild delayed speed and proprioception on LUE   HAND FUNCTION: Grip strength: Right: 100 lbs; Left: 95 lbs  COORDINATION: Finger Nose Finger test: dysmetria bilaterally L> R 9 Hole Peg test: Right: 81.06 sec; Left: 89.12 sec - pt dropping 1 peg on L, 2 on R Box and Blocks:  Right 23 blocks, Left 19 blocks RAM: symmetrical when completed slowly, disdiadochokinesia with RAM.  SENSATION: Numbness/tingling through LUE  COGNITION: Overall cognitive status: Within functional limits for tasks assessed  VISION: Subjective report: double vision, wears glasses with prisms to decrease double vision Baseline vision: Wears glasses all the time  VISION ASSESSMENT: To be further assessed in functional context  PRAXIS: Impaired: Motor planning  OBSERVATIONS: Pt required Min A with ambulation with RW due to multiple LOB.  Pt demonstrating significant difficulty with bilateral tasks and coordination, LUE> RUE.                                                                                                                             TREATMENT DATE:  08/12/23 Educated on role and purpose of OT as well as potential interventions and goals for therapy based on initial evaluation findings.  3 button/unbutton: 54.56 sec      PATIENT EDUCATION: Education details: Educated on role and purpose of OT as well as potential interventions and goals for therapy based on initial evaluation findings. Person educated:  Patient Education method: Explanation Education comprehension: verbalized understanding and needs further education  HOME EXERCISE PROGRAM: TBD   GOALS: Goals reviewed with patient? Yes  SHORT TERM GOALS: Target date: 09/05/23  Pt will be independent with [facility] and [facility] HEP with use of visual handouts. Baseline: new to OPOT Goal status: INITIAL  2.  Pt will verbalize understanding of recommendations for adaptive strategies, AE/DME, and/or modifications to home setup to increase independence and safety with ability to complete ADLs.  Baseline: pt with frequent falls Goal status: INITIAL  3.  Pt will recall at least 3 sensory safety precautions and compensations to improve safety during functional tasks.  Baseline:  impaired sensation LUE> RUE Goal status: INITIAL   LONG TERM GOALS: Target date: 09/26/23  Pt will demonstrate improved gross motor coordination to increase independence with ADLs and IADLs as evidenced by improved score on Box and Blocks assessment by 8 blocks bilaterally.  Baseline: Right 23 blocks, Left 19 blocks Goal status: INITIAL  2.  Pt will demonstrate improved fine motor coordination for ADLs as evidenced by decreasing 9 hole peg test score by 10 secs bilaterally. Baseline: Right: 81.06 sec; Left: 89.12 sec - pt dropping 1 peg on L, 2 on R Goal status: INITIAL  3.  Pt will report and/or demonstrate improved coordination to manage clothing fasteners (pants button) by decreasing score on 3 button/unbutton assessment to 45 seconds or less.  Baseline: 54.56 sec Goal status: INITIAL  4.  Pt will write a 10-15 item grocery list with 100% legibility and good adherence to lines and spacing. Baseline: 80% legibility; pt writing (whales live in a [place] ocean) in 30.85 seconds with large sizing and no regard to lined paper, some letters way above and some below lines. Goal status: INITIAL  5.  Patient will report at least two-point increase in average PSFS score or at  least three-point increase in a single activity score indicating functionally significant improvement given minimum detectable change. Baseline: 1.3 Goal status: INITIAL   ASSESSMENT:  CLINICAL IMPRESSION: Patient is a 38 y.o. male who was seen today for occupational therapy evaluation for R leg swelling and pain due to frequent falls secondary to spinal cerebellar ataxia. Pt reports difficulty with ADLs and IADLs due to impaired gross and fine motor control and balance.  Pt currently lives with sister and niece and is Mod I to setup overall with ADLs, but requires significantly increased time to complete tasks due to ataxia.  Pt will benefit from skilled occupational therapy services to address coordination, pain management, altered sensation, balance, GM/FM control, cognition, safety awareness, introduction of compensatory strategies/AE prn, visual-perception, and implementation of an HEP to improve participation and safety during ADLs and IADLs.    PERFORMANCE DEFICITS: in functional skills including ADLs, IADLs, coordination, proprioception, sensation, pain, Fine motor control, Gross motor control, balance, body mechanics, endurance, decreased knowledge of precautions, decreased knowledge of use of DME, vision, and UE functional use and psychosocial skills including coping strategies, environmental adaptation, and routines and behaviors.   IMPAIRMENTS: are limiting patient from ADLs and IADLs.   CO-MORBIDITIES: may have co-morbidities  that affects occupational performance. Patient will benefit from skilled OT to address above impairments and improve overall function.  MODIFICATION OR ASSISTANCE TO COMPLETE EVALUATION: Min-Moderate modification of tasks or assist with assess necessary to complete an evaluation.  OT OCCUPATIONAL PROFILE AND HISTORY: Detailed assessment: Review of records and additional review of physical, cognitive, psychosocial history related to current functional  performance.  CLINICAL DECISION MAKING: Moderate - several treatment options, min-mod task modification necessary  REHAB POTENTIAL: Good  EVALUATION COMPLEXITY: Moderate    PLAN:  OT FREQUENCY: 1-2x/week  OT DURATION: 6 weeks  PLANNED INTERVENTIONS: 97168 OT Re-evaluation, 97535 self care/ADL training, [address] therapeutic exercise, 97530 therapeutic activity, 97112 neuromuscular re-education, functional mobility training, visual/perceptual remediation/compensation, psychosocial skills training, energy conservation, coping strategies training, patient/family education, and DME and/or AE instructions  RECOMMENDED OTHER SERVICES: may benefit from SLP evaluation due to report of swallowing issues  CONSULTED AND AGREED WITH PLAN OF CARE: Patient  PLAN FOR NEXT SESSION:  initiate [facility] and [facility] HEP  WB activities for motor control  education on AE/DME  Anthonette Kinsman, OTR/L 08/12/2023, 12:42 PM   [facility] at [facility] [address] [place], [address] [place], [place] [address] Phone # [phone] Fax # [phone]

## 2023-08-12 NOTE — Therapy (Signed)
 OUTPATIENT PHYSICAL THERAPY NEURO EVALUATION   Patient Name: Jonathan Hopkins MRN: [id number] DOB:Oct 16, 1985, 38 y.o., male Today's Date: 08/13/2023   PCP: Fonda Hymen, NP REFERRING PROVIDER: Charmel Cooter, MD   END OF SESSION:  PT End of Session - 08/12/23 1028     Visit Number 1    Number of Visits 13    Date for PT Re-Evaluation 09/26/23    Authorization Type Medicare/Medicaid    PT Start Time 1025    PT Stop Time 1100    PT Time Calculation (min) 35 min    Equipment Utilized During Treatment Gait belt    Activity Tolerance Patient tolerated treatment well    Behavior During Therapy WFL for tasks assessed/performed             Past Medical History:  Diagnosis Date   ADHD    Gait abnormality 01/19/2019   Hypertension    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   Past Surgical History:  Procedure Laterality Date   LAPAROSCOPIC APPENDECTOMY N/A 12/06/2021   Procedure: APPENDECTOMY LAPAROSCOPIC;  Surgeon: Oralee Billow, MD;  Location: [facility];  Service: General;  Laterality: N/A;   THYROID  SURGERY     WISDOM TOOTH EXTRACTION     x4   Patient Active Problem List   Diagnosis Date Noted   Frequent falls  R Gluteal Tears 07/24/2023   Abnormality of femur 07/24/2023   Anemia 07/24/2023   Chronic health problem 07/24/2023   Appendicitis 12/05/2021   Gait abnormality 01/19/2019   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  I fell 3-4 times in several days. Pt accompanied by: self  PERTINENT HISTORY: PMH positive for spinal cerebellar ataxia, ADHD, HTN, anxiety/MDD, insomina.38 y/o male admitted to [facility] 07/23/23 with R LE pain after recent increase in falls. MRI positive for R gluteal muscle tears with plan for WBAT potential operative repair, d/c with recommendation to f/u with orthopedics in 1 week. (Pt reports he has not had follow-up)  PAIN:  Are you  having pain? Yes: NPRS scale: 5/10 Pain location: R buttocks, lateral aspect of knee Pain description: shooting pain Aggravating factors: bending down aggravates butt and hamstrings; fully straightening knee Relieving factors: getting out of those positions  PRECAUTIONS: Fall and Other: Pt states no restrictions that he knows of; pt unaware of any follow-up  RED FLAGS: None   WEIGHT BEARING RESTRICTIONS: No  FALLS: Has patient fallen in last 6 months? Yes. Number of falls 1 fall since hospitalization in April; prior to that, 3-4/day  LIVING ENVIRONMENT: Lives with: lives with their family Lives in: House/apartment Stairs: 1 step into home  Has following equipment at home: Single point cane, Walker - 2 wheeled, shower chair, and Grab bars  PLOF: Independent with household mobility with device  PATIENT GOALS: Pt's goals are to improve strength and coordination  OBJECTIVE:  Note: Objective measures were completed at Evaluation unless otherwise noted.  DIAGNOSTIC FINDINGS: No acute fractures  COGNITION: Overall cognitive status: Within functional limits for tasks assessed   SENSATION: Light touch: WFL  COORDINATION: Slowed alternating foot taps  EDEMA:  Mild swelling noted R ankle  PALPATION:  Along R IT band, along lateral aspect of R knee Ligament tests negative for pain  POSTURE: rounded shoulders, forward head, and flexed trunk   LOWER EXTREMITY ROM:     Active   Right Eval Left Eval  Hip flexion    Hip extension    Hip abduction    Hip adduction    Hip internal rotation    Hip external rotation    Knee flexion 115 with pain (sitting)   Knee extension Supine 90-90:  -10 degrees   Ankle dorsiflexion    Ankle plantarflexion    Ankle inversion    Ankle eversion     (Blank rows = not tested)  LOWER EXTREMITY MMT:    MMT Right Eval Left Eval  Hip flexion 4+ 5  Hip extension 3+ 4  Hip abduction 3+   Hip adduction    Hip internal rotation    Hip external rotation    Knee flexion 4+ 5  Knee extension 5 5  Ankle dorsiflexion    Ankle plantarflexion    Ankle inversion    Ankle eversion    (Blank rows = not tested)  TRANSFERS: Sit to stand: Modified independence  Assistive device utilized: None     Stand to sit: Modified independence  Assistive device utilized: decreased eccentric control and None      GAIT: Findings: Gait Characteristics: step through pattern, decreased step length- Right, decreased step length- Left, decreased hip/knee flexion- Right, decreased hip/knee flexion- Left, decreased ankle dorsiflexion- Right, decreased ankle dorsiflexion- Left, ataxic, and wide BOS, Distance walked: 50 ft x 2, Assistive device utilized:Walker - 2 wheeled, Level of assistance: SBA and CGA, and Comments: Pt has increased weightshift and LOB towards R side  FUNCTIONAL TESTS:  5 times sit to stand: 37.72 sec 10 meter walk test: 27.69 sec with RW and supervision (1.18 ft/sec)  TREATMENT DATE: 08/12/2023   PATIENT EDUCATION: Education details: Eval results, POC Person educated: Patient Education method: Explanation Education comprehension: verbalized understanding  HOME EXERCISE PROGRAM: Not yet initiated  GOALS: Goals reviewed with patient? Yes  SHORT TERM GOALS: Target date: 08/29/2023  Pt will be  independent with HEP for improved strength, flexibility. Baseline:  No current HEP Goal status: INITIAL  2.  Pt will improve 5x sit<>stand to less than or equal to 30sec to demonstrate improved functional strength and transfer efficiency. Baseline: 37.72 sec Goal status: INITIAL  LONG TERM GOALS: Target date: 09/26/2023  Pt will be independent with HEP for improved strength, balance, gait, flexibility, pain. Baseline:  No current HEP Goal status: INITIAL  2.  Pt will improve 5x sit<>stand to less than or equal to 20sec to demonstrate improved functional strength and transfer efficiency. Baseline: 37.72 sec Goal status: INITIAL  3.  Pt will demo improved knee flexion in painfree range by at least 10 degrees. Baseline: 115 degrees in sitting, with pain Goal status: INITIAL  4.  Pt will improve gait velocity to at least 1.8 ft/sec for improved gait efficiency and safety. Baseline: 1.18 ft/sec Goal status: INITIAL  5.  Pt will verbalize understanding of fall prevention in home environment. Baseline: was falling 3-4 times/day prior to hospitalization Goal status: INITIAL   ASSESSMENT:  CLINICAL IMPRESSION: Patient is a 38 y.o. male who was seen today for physical therapy evaluation and treatment for RLE swelling, which resulted from R gluteal tears from multiple falls. Pt has hx of SCA-Type 3, with reports of at least 3-4 falls a day leading up to ED visit in April.  He was not using walker prior to hospitalization; however, he is now using RW.  He presents with pain with squatting, stretching motions, decreased flexibility, decreased strength, decreased balance, decreased timing and coordination of gait.  Per hospital d/c notes, he is WBAT RLE from glut tear.  He is at fall risk per FTSTS and gait velocity measures.  He will benefit from skilled PT to address the above stated deficits for decreased fall risk and improved overall functional mobility.  OBJECTIVE IMPAIRMENTS: Abnormal  gait, decreased balance, decreased knowledge of use of DME, decreased mobility, difficulty walking, decreased ROM, decreased strength, impaired flexibility, postural dysfunction, and pain.   ACTIVITY LIMITATIONS: bending, standing, squatting, transfers, and locomotion level  PARTICIPATION LIMITATIONS: meal prep, cleaning, laundry, and community activity  PERSONAL FACTORS: 3+ comorbidities: PMH see above are also affecting patient's functional outcome.   REHAB POTENTIAL: Good  CLINICAL DECISION MAKING: Evolving/moderate complexity  EVALUATION COMPLEXITY: Moderate  PLAN:  PT FREQUENCY: 2x/week  PT DURATION: 6 weeks plus eval  PLANNED INTERVENTIONS: 97750- Physical Performance Testing, 97110-Therapeutic exercises, 97530- Therapeutic activity, 97112- Neuromuscular re-education, 97535- Self Care, 19147- Manual therapy, [phone]- Gait training, Patient/Family education, Balance training, DME instructions, and Cryotherapy  PLAN FOR NEXT SESSION: Initiate HEP for RLE ROM and gentle strength, balance/weigthshifting; gait training with RW (consider ?weighted vest), fall prevention, safety education.   Kelsey Patricia., PT 08/13/2023, 11:37 AM  [facility] at [facility] [address] [place], [address] [place], [place] [address] Phone # [phone] Fax # [phone]

## 2023-08-19 NOTE — Therapy (Signed)
 OUTPATIENT PHYSICAL THERAPY NEURO TREATMENT   Patient Name: Jonathan Hopkins MRN: [id number] DOB:03-Nov-1985, 38 y.o., male Today's Date: 08/20/2023   PCP: Fonda Hymen, NP REFERRING PROVIDER: Charmel Cooter, MD   END OF SESSION:  PT End of Session - 08/20/23 1725     Visit Number 2    Number of Visits 13    Date for PT Re-Evaluation 09/26/23    Authorization Type Medicare/Medicaid    PT Start Time 1224    PT Stop Time 1328    PT Time Calculation (min) 64 min    Equipment Utilized During Treatment Gait belt    Activity Tolerance Other (comment)   session limited by unstable vitals   Behavior During Therapy [facility] for tasks assessed/performed              Past Medical History:  Diagnosis Date   ADHD    Gait abnormality 01/19/2019   Hypertension    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   Past Surgical History:  Procedure Laterality Date   LAPAROSCOPIC APPENDECTOMY N/A 12/06/2021   Procedure: APPENDECTOMY LAPAROSCOPIC;  Surgeon: Oralee Billow, MD;  Location: [facility];  Service: General;  Laterality: N/A;   THYROID SURGERY     WISDOM TOOTH EXTRACTION     x4   Patient Active Problem List   Diagnosis Date Noted   Frequent falls  R Gluteal Tears 07/24/2023   Abnormality of femur 07/24/2023   Anemia 07/24/2023   Chronic health problem 07/24/2023   Appendicitis 12/05/2021   Gait abnormality 01/19/2019   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 Reports 1 fall since last session- walked away from his walker and fell on the TV stand. Denies injury. Has not followed up with ortho- asking who he needs to contact.    Pt accompanied by: self  PERTINENT HISTORY: PMH positive for spinal cerebellar ataxia, ADHD, HTN, anxiety/MDD, insomina.38 y/o male admitted to [facility] 07/23/23 with R LE pain after recent increase in falls. MRI positive for R gluteal muscle tears with plan for WBAT potential operative repair, d/c with  recommendation to f/u with orthopedics in 1 week. (Pt reports he has not had follow-up)  PAIN:  Are you having pain? Yes: NPRS scale: 0 currently/10 Pain location: R buttocks, lateral aspect of knee Pain description: shooting pain Aggravating factors: bending down aggravates butt and hamstrings; fully straightening knee Relieving factors: getting out of those positions  PRECAUTIONS: Fall and Other: Pt states no restrictions that he knows of; pt unaware of any follow-up  RED FLAGS: None   WEIGHT BEARING RESTRICTIONS: No  FALLS: Has patient fallen in last 6 months? Yes. Number of falls 1 fall since hospitalization in April; prior to that, 3-4/day  LIVING ENVIRONMENT: Lives with: lives with their family Lives in: House/apartment Stairs: 1 step into home  Has following equipment at home: Single point cane, Walker - 2 wheeled, shower chair, and Grab bars  PLOF: Independent with household mobility with device  PATIENT GOALS: Pt's goals are to improve strength and coordination  OBJECTIVE:      TODAY'S TREATMENT: 08/20/23 Activity Comments  Gait training with pt's RW ~111ft Very unsteady and walker too low initially. Tendency to lock elbows out and lean posterior with wide BOS; after adjustment of walker height, cues to bend elbows, and maintain feet w/in walker pt was much steadier, requires CGA-min A  LTR for gentle hip ROM Cueing for slow and controlled movement, avoiding pain  Bridge 10x   Significant core/hip sway; cueing for core activation and control; no pain   Pt became nauseous, diaphoretic and hot 161/114 mmHg 89bpm, 95% spO2 He reports that he was instructed to stop taking his BP meds   After water and ice pack 143/102 mmHg, 90bpm Pt reports feeling "110% better"   152/106 mmHg   143/107 84 bpm pt became diaphoretic again and reported "I don't feet good"  but denied dizziness, reports diplopia at baseline, denies N/T. EMS was called d/t pt with continued sx and unable to stand      PATIENT EDUCATION: Education details: advised pt to ask his PCP who he sees on 5/29 about a referral for ortho; edu on WBAT through the R LE and location of his muscle tears; provided handout and edu on BP norms and when to call 911 (see pt instructions); with pt's consent, spoke to patient's friend on the phone to direct him to our building to pick him up, however ultimately EMS was called d/t pt continuing to feel unwell  Person educated: Patient Education method: Explanation, Demonstration, Tactile cues, Verbal cues, and Handouts Education comprehension: verbalized understanding     Note: Objective measures were completed at Evaluation unless otherwise noted.  DIAGNOSTIC FINDINGS: No acute fractures  COGNITION: Overall cognitive status: Within functional limits for tasks assessed   SENSATION: Light touch: WFL  COORDINATION: Slowed alternating foot taps  EDEMA:  Mild swelling noted R ankle  PALPATION:  Along R IT band, along lateral aspect of R knee Ligament tests negative for pain  POSTURE: rounded shoulders, forward head, and flexed trunk   LOWER EXTREMITY ROM:     Active  Right Eval Left Eval  Hip flexion    Hip extension    Hip abduction    Hip adduction    Hip internal rotation    Hip external rotation    Knee flexion 115 with pain (sitting)   Knee extension Supine 90-90:  -10 degrees   Ankle dorsiflexion    Ankle plantarflexion    Ankle inversion     Ankle eversion     (Blank rows =  not tested)  LOWER EXTREMITY MMT:    MMT Right Eval Left Eval  Hip flexion 4+ 5  Hip extension 3+ 4  Hip abduction 3+   Hip adduction    Hip internal rotation    Hip external rotation    Knee flexion 4+ 5  Knee extension 5 5  Ankle dorsiflexion    Ankle plantarflexion    Ankle inversion    Ankle eversion    (Blank rows = not tested)  TRANSFERS: Sit to stand: Modified independence  Assistive device utilized: None     Stand to sit: Modified independence  Assistive device utilized: decreased eccentric control and None      GAIT: Findings: Gait Characteristics: step through pattern, decreased step length- Right, decreased step length- Left, decreased hip/knee flexion- Right, decreased hip/knee flexion- Left, decreased ankle dorsiflexion- Right, decreased ankle dorsiflexion- Left, ataxic, and wide BOS, Distance walked: 50 ft x 2, Assistive device utilized:Walker - 2 wheeled, Level of assistance: SBA and CGA, and Comments: Pt has increased weightshift and LOB towards R side  FUNCTIONAL TESTS:  5 times sit to stand: 37.72 sec 10 meter walk test: 27.69 sec with RW and supervision (1.18 ft/sec)                                                                                                                               TREATMENT DATE: 08/12/2023   PATIENT EDUCATION: Education details: Eval results, POC Person educated: Patient Education method: Explanation Education comprehension: verbalized understanding  HOME EXERCISE PROGRAM: Not yet initiated  GOALS: Goals reviewed with patient? Yes  SHORT TERM GOALS: Target date: 08/29/2023  Pt will be independent with HEP for improved strength, flexibility. Baseline:  No current HEP Goal status: IN PROGRESS  2.  Pt will improve 5x sit<>stand to less than or equal to 30sec to demonstrate improved functional strength and transfer efficiency. Baseline: 37.72 sec Goal status: IN PROGRESS  LONG TERM  GOALS: Target date: 09/26/2023  Pt will be independent with HEP for improved strength, balance, gait, flexibility, pain. Baseline:  No current HEP Goal status:IN PROGRESS  2.  Pt will improve 5x sit<>stand to less than or equal to 20sec to demonstrate improved functional strength and transfer efficiency. Baseline: 37.72 sec Goal status: IN PROGRESS  3.  Pt will demo improved knee flexion in painfree range by at least 10 degrees. Baseline: 115 degrees in sitting, with pain Goal status: IN PROGRESS  4.  Pt will improve gait velocity to at least 1.8 ft/sec for improved gait efficiency and safety. Baseline: 1.18 ft/sec Goal status: IN PROGRESS  5.  Pt will verbalize understanding of fall prevention in home environment. Baseline: was falling 3-4 times/day prior to hospitalization Goal status: IN PROGRESS   ASSESSMENT:  CLINICAL IMPRESSION: Patient arrived to session with report of 1 fall since last session without injury. Worked on [employer], operational with RW for max safety. Patient tolerated gentle hip ROM and activities without pain.  In supine, patient suddenly became diaphoretic and reported not feeling well. BP was checked and was significantly elevated. Allowed sitting rest break, which allowed BP to settle down. Patient was educated on BP values and to contact PCP about resuming meds. Upon trying to stand up, patient reported feeling unwell again and was not able to stand, thus EMS was called. After EMS performed their assessment, patient ultimately refused to be taken to the ED and left with his friend.   OBJECTIVE IMPAIRMENTS: Abnormal gait, decreased balance, decreased knowledge of use of DME, decreased mobility, difficulty walking, decreased ROM, decreased strength, impaired flexibility, postural dysfunction, and pain.   ACTIVITY LIMITATIONS: bending, standing, squatting, transfers, and locomotion level  PARTICIPATION LIMITATIONS: meal prep, cleaning, laundry, and community  activity  PERSONAL FACTORS: 3+ comorbidities: PMH see above are also affecting patient's functional outcome.   REHAB POTENTIAL: Good  CLINICAL DECISION MAKING: Evolving/moderate complexity  EVALUATION COMPLEXITY: Moderate  PLAN:  PT FREQUENCY: 2x/week  PT DURATION: 6 weeks plus eval  PLANNED INTERVENTIONS: 97750- Physical Performance Testing, 97110-Therapeutic exercises, 97530- Therapeutic activity, [id number]- Neuromuscular re-education, 97535- Self Care, [id number]- Manual therapy, [phone]- Gait training, Patient/Family education, Balance training, DME instructions, and Cryotherapy  PLAN FOR NEXT SESSION: monitor BP; Initiate HEP for RLE ROM and gentle strength, balance/weigthshifting; gait training with RW (consider ?weighted vest), fall prevention, safety education.   Thaddeus Filippo, PT, DPT 08/20/23 5:31 PM  [facility] [address], [address] [place], [place] [address] Phone # [phone] Fax # [phone]

## 2023-08-20 ENCOUNTER — Encounter: Payer: Self-pay | Admitting: Physical Therapy

## 2023-08-20 ENCOUNTER — Ambulatory Visit: Admitting: Physical Therapy

## 2023-08-20 ENCOUNTER — Ambulatory Visit: Admitting: Occupational Therapy

## 2023-08-20 DIAGNOSIS — R270 Ataxia, unspecified: Secondary | ICD-10-CM | POA: Diagnosis not present

## 2023-08-20 DIAGNOSIS — M6281 Muscle weakness (generalized): Secondary | ICD-10-CM

## 2023-08-20 DIAGNOSIS — R2689 Other abnormalities of gait and mobility: Secondary | ICD-10-CM

## 2023-08-20 DIAGNOSIS — R2681 Unsteadiness on feet: Secondary | ICD-10-CM

## 2023-08-20 NOTE — Patient Instructions (Signed)
 Continue checking your blood pressure every hour today. If the top number is greater than 180 or the bottom number is greater than 110, call 911 as these levels can cause a stroke. Call your doctor about starting to take your blood pressure meds again.

## 2023-08-27 ENCOUNTER — Ambulatory Visit: Admitting: Occupational Therapy

## 2023-08-27 VITALS — BP 149/88 | HR 97

## 2023-08-27 DIAGNOSIS — R270 Ataxia, unspecified: Secondary | ICD-10-CM | POA: Diagnosis not present

## 2023-08-27 DIAGNOSIS — R2689 Other abnormalities of gait and mobility: Secondary | ICD-10-CM

## 2023-08-27 DIAGNOSIS — R293 Abnormal posture: Secondary | ICD-10-CM

## 2023-08-27 DIAGNOSIS — M6281 Muscle weakness (generalized): Secondary | ICD-10-CM

## 2023-08-27 DIAGNOSIS — R41842 Visuospatial deficit: Secondary | ICD-10-CM

## 2023-08-27 DIAGNOSIS — R278 Other lack of coordination: Secondary | ICD-10-CM

## 2023-08-27 DIAGNOSIS — R2681 Unsteadiness on feet: Secondary | ICD-10-CM

## 2023-08-27 DIAGNOSIS — R208 Other disturbances of skin sensation: Secondary | ICD-10-CM

## 2023-08-27 NOTE — Patient Instructions (Addendum)
   Removed: Shuffling cards

## 2023-08-27 NOTE — Therapy (Signed)
 OUTPATIENT OCCUPATIONAL THERAPY NEURO Treatment  Patient Name: Jonathan Hopkins MRN: [id number] DOB:02-Mar-1986, 38 y.o., male Today's Date: 08/27/2023  PCP: Fonda Hymen, NP REFERRING PROVIDER: Charmel Cooter, MD  END OF SESSION:  OT End of Session - 08/27/23 1537     Visit Number 2    Number of Visits 11    Date for OT Re-Evaluation 09/26/23    Authorization Type Medicare A&B / [place] Medicaid    OT Start Time 1452    OT Stop Time 1530    OT Time Calculation (min) 38 min    Activity Tolerance Patient tolerated treatment well    Behavior During Therapy WFL for tasks assessed/performed              Past Medical History:  Diagnosis Date   ADHD    Gait abnormality 01/19/2019   Hypertension    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   Past Surgical History:  Procedure Laterality Date   LAPAROSCOPIC APPENDECTOMY N/A 12/06/2021   Procedure: APPENDECTOMY LAPAROSCOPIC;  Surgeon: Oralee Billow, MD;  Location: [facility];  Service: General;  Laterality: N/A;   THYROID SURGERY     WISDOM TOOTH EXTRACTION     x4   Patient Active Problem List   Diagnosis Date Noted   Frequent falls  R Gluteal Tears 07/24/2023   Abnormality of femur 07/24/2023   Anemia 07/24/2023   Chronic health problem 07/24/2023   Appendicitis 12/05/2021   Gait abnormality 01/19/2019   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 Pt reported not checking BP at home. Pt reported upcoming visit with PCP  tomorrow at 1:40 PM.   Pt accompanied by: self (pt drove self)  PERTINENT HISTORY: PMH positive for spinal cerebellar ataxia, ADHD, HTN, anxiety/MDD, insomina.38 y/o male admitted to [facility] 07/23/23 with R LE pain after recent increase in falls. MRI positive for R gluteal muscle tears with plan for WBAT potential operative repair, d/c with recommendation to f/u with orthopedics in 1 week.  PRECAUTIONS: Fall  WEIGHT BEARING RESTRICTIONS: No, WBAT  PAIN:  Are you having pain? Yes: NPRS scale: 5/10 Pain location: R glute, hamstring knee Pain description: sharp Aggravating factors: extending R leg and prolonged standing Relieving factors: sitting, bending knee  FALLS: Has patient fallen in last 6 months? Yes. Number of falls reports falling multiple times per day  08/27/23 - Pt reported fall last night 08/26/23 d/t falling backwards while up and walking. Pt had scrapes on L leg and reported only injury from fall. Pt reported able to get up by himself.  LIVING ENVIRONMENT: Lives with: lives with their family (sister and niece) Lives in: House/apartment Stairs: 1 step into home  Has following equipment at home: Otho Blitz - 2 wheeled, Environmental consultant - 4 wheeled, shower chair, Grab bars, and non skid floor in shower  PLOF: Independent and Independent with basic ADLs  PATIENT GOALS: [facility] skills to open food containers and separate coffee filters, button pants  OBJECTIVE:  Note: Objective measures were completed at Evaluation unless otherwise noted.  HAND DOMINANCE: Right  ADLs: Transfers/ambulation related to ADLs: Mod I with RW, reports frequent falls Eating: difficulty with cutting and scooping foods,  Grooming: decreased motor control when brushing teeth and shaving UB Dressing: Mod I, in sitting LB Dressing: difficulty with buttoning pants, belt, tying shoes (tends to just slip shoes on/off) Toileting: Mod I - has a grab bar next to toilet Bathing: Mod I - using shower chair and grab bars Tub  Shower transfers: Mod I - using grab bars Equipment: Shower seat with back, Grab bars, and Walk in shower  IADLs: Light housekeeping: "challenging" as he was using a cane before recent hospitalization Meal Prep: uses microwave and makes smaller meals Community mobility: driving Medication management: Mod I - utilizing pill box Handwriting: 80% legibility; pt writing (whales live in a blue ocean) in 30.85 seconds with large sizing and no regard to lined paper, some letters way above and some below lines.  MOBILITY STATUS: Needs Assist: CGA to Min A with ambulation from treatment room to front of clinic due to 2 LOB and Hx of falls  POSTURE COMMENTS:  rounded shoulders and forward head  ACTIVITY TOLERANCE: Activity tolerance: WFL for tasks assessed on eval  FUNCTIONAL OUTCOME MEASURES: PSFS  UPPER EXTREMITY ROM:  WFL bilaterally, pt with mild delayed speed and proprioception on LUE   HAND FUNCTION: Grip strength: Right: 100 lbs; Left: 95 lbs  COORDINATION: Finger Nose Finger test: dysmetria bilaterally L> R 9 Hole Peg test: Right: 81.06 sec; Left: 89.12 sec - pt dropping 1 peg on L, 2 on R Box and Blocks:  Right 23 blocks, Left 19 blocks RAM: symmetrical when completed slowly, disdiadochokinesia with RAM.  SENSATION: Numbness/tingling through LUE  COGNITION: Overall cognitive status: Within functional limits for tasks assessed  VISION: Subjective report: double vision, wears glasses with prisms to decrease double vision Baseline vision: Wears glasses all the time  VISION ASSESSMENT: To be further assessed in functional context  PRAXIS: Impaired: Motor planning  OBSERVATIONS: Pt required Min A with ambulation with RW due to multiple LOB.  Pt demonstrating significant difficulty with bilateral tasks and coordination, LUE> RUE.                                                                                                                             TREATMENT DATE:    Self-Care Vitals assessed, see vital signs. BP 149/88, BP elevated though pt asymptomatic. Therefore, session continued though all tasks completed seated while monitoring for symptoms. Pt denied symptoms throughout session. OT educated pt on BP parameters, symptoms of high/low BP, importance of hydration, good quality sleep, and positioning/posture. Pt acknowledged understanding of all.  OT educated pt on strategies to reduce fall risk. Pt acknowledged understanding.  TherAct OT initiated FM coordination HEP (handout provided, see pt instructions) - to improve B UE FM coordination, dexterity, proprioception, motor planning and sequencing. Pt returned demonstration of each exercise with each UE: Picking up objects and placing in a container Stacking towers  of 1-inch blocks, attempted coins per pt request though pt demo'd difficulty Finger-to-palm then palm-to-finger translation of 1-inch blocks Turning cards over 1 at a time Hold deck of cards in palm of hand and push off 1 card at a time from the top of the deck using only thumb Tear piece of tissue and rolling into small balls with fingertips Rotating golf balls Rolling pen between fingers and thumb, pen "twirl" (rotation), pen "shift" (translation)   Pt benefited from v/c for pacing and to reduce speed to allow for quality motor movements and time for motor planning. Pt returned demo.  Neuro Re-Ed Throughout session, pt benefited from v/c to keep feet flat on floor and for upright posture during task. Pt initially without a support behind back though OT provided back support and pt demo'd improved posture and BUE efficiency with back support. OT recommended to pt to complete FM tasks at home in a supported chair with a back. Pt acknowledged understanding.   Pt demo'd posterior lean and unsteady gait when walking. OT educated pt on upright standing and walking posture and discussed additional strategies to reduce fall risk and improve  safety. Pt acknowledged understanding and returned demo with v/c.   PATIENT EDUCATION: Education details: see today's treatment above Person educated: Patient Education method: Explanation and Handouts Education comprehension: verbalized understanding and needs further education  HOME EXERCISE PROGRAM: 08/27/23 - FM coordination HEP (see pt instructions, handout provided)   GOALS: Goals reviewed with patient? Yes  SHORT TERM GOALS: Target date: 09/05/23  Pt will be independent with [facility] and [facility] HEP with use of visual handouts. Baseline: new to OPOT Goal status: in progress  2.  Pt will verbalize understanding of recommendations for adaptive strategies, AE/DME, and/or modifications to home setup to increase independence and safety with ability to complete ADLs.  Baseline: pt with frequent falls Goal status: in progress  3.  Pt will recall at least 3 sensory safety precautions and compensations to improve safety during functional tasks.  Baseline: impaired sensation LUE> RUE Goal status: INITIAL   LONG TERM GOALS: Target date: 09/26/23  Pt will demonstrate improved gross motor coordination to increase independence with ADLs and IADLs as evidenced by improved score on Box and Blocks assessment by 8 blocks bilaterally.  Baseline: Right 23 blocks, Left 19 blocks Goal status: in progress  2.  Pt will demonstrate improved fine motor coordination for ADLs as evidenced by decreasing 9 hole peg test score by 10 secs bilaterally. Baseline: Right: 81.06 sec; Left: 89.12 sec - pt dropping 1 peg on L, 2 on R Goal status: in progress  3.  Pt will report and/or demonstrate improved coordination to manage clothing fasteners (pants button) by decreasing score on 3 button/unbutton assessment to 45 seconds or less.  Baseline: 54.56 sec Goal status: in progress  4.  Pt will write a 10-15 item grocery list with 100% legibility and good adherence to lines and spacing. Baseline: 80% legibility; pt  writing (whales live in a blue ocean) in 30.85 seconds with large sizing and no regard to lined paper, some letters way above and some below lines. Goal status: INITIAL  5.  Patient will report at least two-point increase in average PSFS score or at least three-point increase in a single activity score indicating functionally significant improvement given minimum detectable change. Baseline: 1.3 Goal status: INITIAL   ASSESSMENT:  CLINICAL IMPRESSION: Patient is a 38 y.o. male who was seen today for occupational therapy treatment for R leg swelling  and pain due to frequent falls secondary to spinal cerebellar ataxia. Pt demo'd elevated BP though asymptomatic throughout session. Pt tolerated FM tasks fairly tasks well with some BUE FM difficulty as evidenced by dropping objects intermittently secondary to ataxia. Noted that pt demo'd increased difficulty with LUE coordination compared to RUE. Pt benefited from verbal encouragement throughout session. Pt benefited from v/c and education regarding upright seated and standing posture to improve proximal stability for distal mobility and to improve safety.   Pt will benefit from skilled occupational therapy services to address coordination, pain management, altered sensation, balance, GM/FM control, cognition, safety awareness, introduction of compensatory strategies/AE prn, visual-perception, and implementation of an HEP to improve participation and safety during ADLs and IADLs.    PERFORMANCE DEFICITS: in functional skills including ADLs, IADLs, coordination, proprioception, sensation, pain, Fine motor control, Gross motor control, balance, body mechanics, endurance, decreased knowledge of precautions, decreased knowledge of use of DME, vision, and UE functional use and psychosocial skills including coping strategies, environmental adaptation, and routines and behaviors.   IMPAIRMENTS: are limiting patient from ADLs and IADLs.   CO-MORBIDITIES: may  have co-morbidities  that affects occupational performance. Patient will benefit from skilled OT to address above impairments and improve overall function.  MODIFICATION OR ASSISTANCE TO COMPLETE EVALUATION: Min-Moderate modification of tasks or assist with assess necessary to complete an evaluation.  OT OCCUPATIONAL PROFILE AND HISTORY: Detailed assessment: Review of records and additional review of physical, cognitive, psychosocial history related to current functional performance.  CLINICAL DECISION MAKING: Moderate - several treatment options, min-mod task modification necessary  REHAB POTENTIAL: Good  EVALUATION COMPLEXITY: Moderate    PLAN:  OT FREQUENCY: 1-2x/week  OT DURATION: 6 weeks  PLANNED INTERVENTIONS: 97168 OT Re-evaluation, 97535 self care/ADL training, 54098 therapeutic exercise, 97530 therapeutic activity, 97112 neuromuscular re-education, functional mobility training, visual/perceptual remediation/compensation, psychosocial skills training, energy conservation, coping strategies training, patient/family education, and DME and/or AE instructions  RECOMMENDED OTHER SERVICES: may benefit from SLP evaluation due to report of swallowing issues  CONSULTED AND AGREED WITH PLAN OF CARE: Patient  PLAN FOR NEXT SESSION:  Monitor BP  Review FM coordination HEP PRN  initiate  [facility] HEP  WB activities for motor control  education on AE/DME   Oakley Bellman, OTR/L 08/27/2023, 3:53 PM   [facility] at [facility] [address] [place], [place] [address] Phone # [phone] Fax # [phone]

## 2023-09-02 ENCOUNTER — Ambulatory Visit: Attending: Family Medicine | Admitting: Occupational Therapy

## 2023-09-02 ENCOUNTER — Ambulatory Visit: Admitting: Physical Therapy

## 2023-09-02 ENCOUNTER — Encounter: Payer: Self-pay | Admitting: Physical Therapy

## 2023-09-02 DIAGNOSIS — M6281 Muscle weakness (generalized): Secondary | ICD-10-CM | POA: Diagnosis present

## 2023-09-02 DIAGNOSIS — R2681 Unsteadiness on feet: Secondary | ICD-10-CM

## 2023-09-02 DIAGNOSIS — R278 Other lack of coordination: Secondary | ICD-10-CM | POA: Insufficient documentation

## 2023-09-02 DIAGNOSIS — R293 Abnormal posture: Secondary | ICD-10-CM | POA: Insufficient documentation

## 2023-09-02 DIAGNOSIS — R270 Ataxia, unspecified: Secondary | ICD-10-CM | POA: Diagnosis present

## 2023-09-02 DIAGNOSIS — R208 Other disturbances of skin sensation: Secondary | ICD-10-CM | POA: Insufficient documentation

## 2023-09-02 DIAGNOSIS — R41842 Visuospatial deficit: Secondary | ICD-10-CM | POA: Insufficient documentation

## 2023-09-02 DIAGNOSIS — R2689 Other abnormalities of gait and mobility: Secondary | ICD-10-CM | POA: Insufficient documentation

## 2023-09-02 NOTE — Therapy (Signed)
 OUTPATIENT PHYSICAL THERAPY NEURO TREATMENT   Patient Name: Jonathan Hopkins MRN: [id number] DOB:[birth year], 38 y.o., male Today's Date: 09/02/2023   PCP: Fonda Hymen, NP REFERRING PROVIDER: Charmel Cooter, MD   END OF SESSION:  PT End of Session - 09/02/23 1233     Visit Number 3    Number of Visits 13    Date for PT Re-Evaluation 09/26/23    Authorization Type Medicare/Medicaid    PT Start Time 1235    PT Stop Time 1315    PT Time Calculation (min) 40 min    Equipment Utilized During Treatment Gait belt    Activity Tolerance Patient tolerated treatment well    Behavior During Therapy WFL for tasks assessed/performed               Past Medical History:  Diagnosis Date   ADHD    Gait abnormality 01/19/2019   Hypertension    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   Past Surgical History:  Procedure Laterality Date   LAPAROSCOPIC APPENDECTOMY N/A 12/06/2021   Procedure: APPENDECTOMY LAPAROSCOPIC;  Surgeon: Oralee Billow, MD;  Location: [facility];  Service: General;  Laterality: N/A;   THYROID SURGERY     WISDOM TOOTH EXTRACTION     x4   Patient Active Problem List   Diagnosis Date Noted   Frequent falls  R Gluteal Tears 07/24/2023   Abnormality of femur 07/24/2023   Anemia 07/24/2023   Chronic health problem 07/24/2023   Appendicitis 12/05/2021   Gait abnormality 01/19/2019   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  No falls.   Will talk to MD about ortho follow-up.     Pt accompanied by: self  PERTINENT HISTORY: PMH positive for spinal cerebellar ataxia, ADHD, HTN, anxiety/MDD, insomina.38 y/o male admitted to [facility] 07/23/23 with R LE pain after recent increase in falls. MRI positive for R gluteal muscle tears with plan for WBAT potential operative repair, d/c with recommendation to f/u with orthopedics in 1 week. (Pt reports he has not had follow-up)  PAIN:  Are you having pain? Yes: NPRS scale: 3-4/10  Pain location: R buttocks, lateral aspect of knee Pain description: shooting pain Aggravating factors: bending down aggravates butt and hamstrings; fully straightening knee Relieving factors: getting out of those positions  PRECAUTIONS: Fall and Other: Pt states no restrictions that he knows of; pt unaware of any follow-up  RED FLAGS: None   WEIGHT BEARING RESTRICTIONS: No  FALLS: Has patient fallen in last 6 months? Yes. Number of falls 1 fall since hospitalization in April; prior to that, 3-4/day  LIVING ENVIRONMENT: Lives with: lives with their family Lives in: House/apartment Stairs: 1 step into home  Has following equipment at home: Single point cane, Environmental consultant - 2 wheeled, shower chair, and Grab bars  PLOF: Independent with household mobility with device  PATIENT GOALS: Pt's goals are to improve strength and coordination  OBJECTIVE:     TODAY'S TREATMENT: 09/02/2023 Activity Comments  Vitals-sitting 127/92; HR 99 bpm  Seated hamstring stretch-foot propped on floor, 3 x 30 sec Cues for technique  Figure 4 stretch, piriformis stretch in sitting R and L, 30 sec More tightness R  LAQ, 10 reps 2 sets Seated hamstring curls, 2 x 10 Glut sets x 10 reps Added green band-cues for form, breathing  Gait with RW with 3# weight each side Pt notes improved steadiness with weights on walker  Vitals 131/96 HR 102 bpm   Access Code: [email or web address] URL: [email or web address] Date: 09/02/2023 Prepared  by: [facility] Outpatient  Rehab - [facility]  Program Notes *Look for adjustable weight ankle weights (2-10#)[employer] is an example of a good brand  Exercises - Seated Gluteal Sets  - 1-2 x daily - 7 x weekly - 2 sets - 10 reps - 3 sec hold - Sitting Knee Extension with Resistance  - 1 x daily - 4 x weekly - 3 sets - 10 reps - Seated Hamstring Curl with Anchored Resistance  - 1 x daily - 4 x weekly - 3 sets - 10 reps    PATIENT EDUCATION: Education details: HEP, discussed ankle weights for home to use for strengthening and possibly to use to help with stability with gait Person educated: Patient Education method: Explanation, Demonstration, Tactile cues, Verbal cues, and Handouts Education comprehension: verbalized understanding     Note: Objective measures were completed at Evaluation unless otherwise noted.  DIAGNOSTIC FINDINGS: No acute fractures  COGNITION: Overall cognitive status: Within functional limits for tasks assessed   SENSATION: Light touch: WFL  COORDINATION: Slowed alternating foot taps  EDEMA:  Mild swelling noted R ankle  PALPATION:  Along R IT band, along lateral aspect of R knee Ligament tests negative for pain  POSTURE: rounded shoulders, forward head, and flexed trunk   LOWER EXTREMITY ROM:     Active  Right Eval Left Eval  Hip flexion    Hip extension    Hip abduction    Hip adduction    Hip internal rotation    Hip external rotation    Knee flexion 115 with pain (sitting)   Knee extension Supine 90-90:  -10 degrees   Ankle dorsiflexion    Ankle plantarflexion    Ankle inversion    Ankle eversion     (Blank rows = not tested)  LOWER EXTREMITY MMT:    MMT Right Eval Left Eval  Hip flexion 4+ 5  Hip extension 3+ 4  Hip abduction 3+   Hip adduction    Hip internal rotation    Hip external rotation    Knee flexion 4+ 5  Knee extension 5  5  Ankle dorsiflexion    Ankle plantarflexion    Ankle inversion    Ankle  eversion    (Blank rows = not tested)  TRANSFERS: Sit to stand: Modified independence  Assistive device utilized: None     Stand to sit: Modified independence  Assistive device utilized: decreased eccentric control and None      GAIT: Findings: Gait Characteristics: step through pattern, decreased step length- Right, decreased step length- Left, decreased hip/knee flexion- Right, decreased hip/knee flexion- Left, decreased ankle dorsiflexion- Right, decreased ankle dorsiflexion- Left, ataxic, and wide BOS, Distance walked: 50 ft x 2, Assistive device utilized:Walker - 2 wheeled, Level of assistance: SBA and CGA, and Comments: Pt has increased weightshift and LOB towards R side  FUNCTIONAL TESTS:  5 times sit to stand: 37.72 sec 10 meter walk test: 27.69 sec with RW and supervision (1.18 ft/sec)                                                                                                                               TREATMENT DATE: 08/12/2023   PATIENT EDUCATION: Education details: Eval results, POC Person educated: Patient Education method: Explanation Education comprehension: verbalized understanding  HOME EXERCISE PROGRAM: Not yet initiated  GOALS: Goals reviewed with patient? Yes  SHORT TERM GOALS: Target date: 08/29/2023  Pt will be independent with HEP for improved strength, flexibility. Baseline:  No current HEP; initiated 09/02/2023 Goal status: IN PROGRESS  2.  Pt will improve 5x sit<>stand to less than or equal to 30sec to demonstrate improved functional strength and transfer efficiency. Baseline: 37.72 sec Goal status: IN PROGRESS  LONG TERM GOALS: Target date: 09/26/2023  Pt will be independent with HEP for improved strength, balance, gait, flexibility, pain. Baseline:  No current HEP Goal status:IN PROGRESS  2.  Pt will improve 5x sit<>stand to less than or equal to 20sec to demonstrate improved functional strength and transfer efficiency. Baseline: 37.72  sec Goal status: IN PROGRESS  3.  Pt will demo improved knee flexion in painfree range by at least 10 degrees. Baseline: 115 degrees in sitting, with pain Goal status: IN PROGRESS  4.  Pt will improve gait velocity to at least 1.8 ft/sec for improved gait efficiency and safety. Baseline: 1.18 ft/sec Goal status: IN PROGRESS  5.  Pt will verbalize understanding of fall prevention in home environment. Baseline: was falling 3-4 times/day prior to hospitalization Goal status: IN PROGRESS   ASSESSMENT:  CLINICAL IMPRESSION: Pt presents today for only second visit after eval (due to scheduling conflicts/MD appt?).  Skilled PT session focused on gentle stretching and gentle strengthening of BLEs.  Pt needs cues for correct form, posture and to slow pace of exercises; for the most part, he demo awareness to correct either with or before cues.  Use 3# weights on either side of pt's RW today for short distance gait and pt notices improved stability, less ataxia and sway; may conitnue with adding weights  to walker for improved stability with gait. Vitals remained WFL throughout session today.  Pt will continue to benefit from skilled PT towards goals for improved functional mobility and decreased fall risk.   OBJECTIVE IMPAIRMENTS: Abnormal gait, decreased balance, decreased knowledge of use of DME, decreased mobility, difficulty walking, decreased ROM, decreased strength, impaired flexibility, postural dysfunction, and pain.   ACTIVITY LIMITATIONS: bending, standing, squatting, transfers, and locomotion level  PARTICIPATION LIMITATIONS: meal prep, cleaning, laundry, and community activity  PERSONAL FACTORS: 3+ comorbidities: PMH see above are also affecting patient's functional outcome.   REHAB POTENTIAL: Good  CLINICAL DECISION MAKING: Evolving/moderate complexity  EVALUATION COMPLEXITY: Moderate  PLAN:  PT FREQUENCY: 2x/week  PT DURATION: 6 weeks plus eval  PLANNED INTERVENTIONS:  97750- Physical Performance Testing, 97110-Therapeutic exercises, 97530- Therapeutic activity, [id number]- Neuromuscular re-education, 97535- Self Care, 16109- Manual therapy, [phone]- Gait training, Patient/Family education, Balance training, DME instructions, and Cryotherapy  PLAN FOR NEXT SESSION: monitor BP; Check STGs.  Review HEP for RLE ROM and gentle strength, balance/weigthshifting; gait training with RW (consider weights added to walker), fall prevention, safety education.   Dessie Flow, PT 09/02/23 3:27 PM Phone: [phone] Fax: [phone]  [facility] at [facility] [address] [place], [address] [place], [place] [address] Phone # [phone] Fax # [phone]

## 2023-09-02 NOTE — Therapy (Signed)
 OUTPATIENT OCCUPATIONAL THERAPY NEURO Treatment  Patient Name: Jonathan Hopkins MRN: [id number] DOB:July 16, 1985, 38 y.o., male Today's Date: 09/02/2023  PCP: Fonda Hymen, NP REFERRING PROVIDER: Charmel Cooter, MD  END OF SESSION:  OT End of Session - 09/02/23 1549     Visit Number 3    Number of Visits 11    Date for OT Re-Evaluation 09/26/23    Authorization Type Medicare A&B /  Medicaid    OT Start Time 1320    OT Stop Time 1400    OT Time Calculation (min) 40 min    Activity Tolerance Patient tolerated treatment well    Behavior During Therapy WFL for tasks assessed/performed               Past Medical History:  Diagnosis Date   ADHD    Gait abnormality 01/19/2019   Hypertension    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   Past Surgical History:  Procedure Laterality Date   LAPAROSCOPIC APPENDECTOMY N/A 12/06/2021   Procedure: APPENDECTOMY LAPAROSCOPIC;  Surgeon: Oralee Billow, MD;  Location: [facility];  Service: General;  Laterality: N/A;   THYROID SURGERY     WISDOM TOOTH EXTRACTION     x4   Patient Active Problem List   Diagnosis Date Noted   Frequent falls  R Gluteal Tears 07/24/2023   Abnormality of femur 07/24/2023   Anemia 07/24/2023   Chronic health problem 07/24/2023   Appendicitis 12/05/2021   Gait abnormality 01/19/2019   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    Pt accompanied by: self (pt drove self)  PERTINENT HISTORY: PMH positive for spinal cerebellar ataxia, ADHD, HTN, anxiety/MDD, insomina.38 y/o male admitted  to [facility] 07/23/23 with R LE pain after recent increase in falls. MRI positive for R gluteal muscle tears with plan for WBAT potential operative repair, d/c with recommendation to f/u with orthopedics in 1 week.  PRECAUTIONS: Fall  WEIGHT BEARING RESTRICTIONS: No, WBAT  PAIN:  Are you having pain? Yes: NPRS scale: 5/10 Pain location: R glute, hamstring knee Pain description: sharp Aggravating factors: extending R leg and prolonged standing Relieving factors: sitting, bending knee  FALLS: Has patient fallen in last 6 months? Yes. Number of falls reports falling multiple times per day  08/27/23 - Pt reported fall last night 08/26/23 d/t falling backwards while up and walking. Pt had scrapes on L leg and reported only injury from fall. Pt reported able to get up by himself.  LIVING ENVIRONMENT: Lives with: lives with their family (sister and niece) Lives in: House/apartment Stairs: 1 step into home  Has following equipment at home: Otho Blitz - 2 wheeled, Environmental consultant - 4 wheeled, shower chair, Grab bars, and non skid floor in shower  PLOF: Independent and Independent with basic ADLs  PATIENT GOALS: [facility] skills to open food containers and separate coffee filters, button pants  OBJECTIVE:  Note: Objective measures were completed at Evaluation unless otherwise noted.  HAND DOMINANCE: Right  ADLs: Transfers/ambulation related to ADLs: Mod I with RW, reports frequent falls Eating: difficulty with cutting and scooping foods,  Grooming: decreased motor control when  brushing teeth and shaving UB Dressing: Mod I, in sitting LB Dressing: difficulty with buttoning pants, belt, tying shoes (tends to just slip shoes on/off) Toileting: Mod I - has a grab bar next to toilet Bathing: Mod I - using shower chair and grab bars Tub Shower transfers: Mod I - using grab bars Equipment: Shower seat with back, Grab bars, and Walk in shower  IADLs: Light housekeeping: "challenging" as he was using a cane before  recent hospitalization Meal Prep: uses microwave and makes smaller meals Community mobility: driving Medication management: Mod I - utilizing pill box Handwriting: 80% legibility; pt writing (whales live in a blue ocean) in 30.85 seconds with large sizing and no regard to lined paper, some letters way above and some below lines.  MOBILITY STATUS: Needs Assist: CGA to Min A with ambulation from treatment room to front of clinic due to 2 LOB and Hx of falls  POSTURE COMMENTS:  rounded shoulders and forward head  ACTIVITY TOLERANCE: Activity tolerance: WFL for tasks assessed on eval  FUNCTIONAL OUTCOME MEASURES: PSFS  UPPER EXTREMITY ROM:  WFL bilaterally, pt with mild delayed speed and proprioception on LUE   HAND FUNCTION: Grip strength: Right: 100 lbs; Left: 95 lbs  COORDINATION: Finger Nose Finger test: dysmetria bilaterally L> R 9 Hole Peg test: Right: 81.06 sec; Left: 89.12 sec - pt dropping 1 peg on L, 2 on R Box and Blocks:  Right 23 blocks, Left 19 blocks RAM: symmetrical when completed slowly, disdiadochokinesia with RAM.  SENSATION: Numbness/tingling through LUE  COGNITION: Overall cognitive status: Within functional limits for tasks assessed  VISION: Subjective report: double vision, wears glasses with prisms to decrease double vision Baseline vision: Wears glasses all the time  VISION ASSESSMENT: To be further assessed in functional context  PRAXIS: Impaired: Motor planning  OBSERVATIONS: Pt required Min A with ambulation with RW due to multiple LOB.  Pt demonstrating significant difficulty with bilateral tasks and coordination, LUE> RUE.                                                                                                                             TREATMENT DATE:  09/02/23 BP 131/96 at beginning of session. Tying shoes: engaged in massed practice with tying shoes with tissue box shoe example.  Pt demonstrating difficulty with sustaining grasp and  manipulation of laces with L hand.  OT educating on 2 bunny ear option as well as securing ends of laces and completing square knot option to increase ease with tying shoes.  Attempted square knot option on personal shoe, however due to larger holes at laces unable to secure laces.  OT providing cues to visually attend to task to increase motor control and to continue to attempt alternative strategies to see if one may work better for pt. Buttons: engaged in buttoning/unbuttoning 3 buttons with difficulty with buttoning due to decreased coordination L >R UE.  OT educating pt on use of button hook to  fasten buttons.  Pt demonstrating significant improvements with use of button hook.  OT providing recommendation for purchase options if desired.   Coordination: OT reiterating engagement in HEP provided during previous session with pt stating that he had lost the paperwork.  OT providing additional handout and encouraging completing a few at a time 2-3x/day for continued focus on motor control.   PATIENT EDUCATION: Education details: see today's treatment above Person educated: Patient Education method: Explanation and Handouts Education comprehension: verbalized understanding and needs further education  HOME EXERCISE PROGRAM: 08/27/23 - FM coordination HEP (see pt instructions, handout provided)   GOALS: Goals reviewed with patient? Yes  SHORT TERM GOALS: Target date: 09/05/23  Pt will be independent with [facility] and [facility] HEP with use of visual handouts. Baseline: new to OPOT Goal status: in progress  2.  Pt will verbalize understanding of recommendations for adaptive strategies, AE/DME, and/or modifications to home setup to increase independence and safety with ability to complete ADLs.  Baseline: pt with frequent falls Goal status: in progress  3.  Pt will recall at least 3 sensory safety precautions and compensations to improve safety during functional tasks.  Baseline: impaired sensation LUE>  RUE Goal status: INITIAL   LONG TERM GOALS: Target date: 09/26/23  Pt will demonstrate improved gross motor coordination to increase independence with ADLs and IADLs as evidenced by improved score on Box and Blocks assessment by 8 blocks bilaterally.  Baseline: Right 23 blocks, Left 19 blocks Goal status: in progress  2.  Pt will demonstrate improved fine motor coordination for ADLs as evidenced by decreasing 9 hole peg test score by 10 secs bilaterally. Baseline: Right: 81.06 sec; Left: 89.12 sec - pt dropping 1 peg on L, 2 on R Goal status: in progress  3.  Pt will report and/or demonstrate improved coordination to manage clothing fasteners (pants button) by decreasing score on 3 button/unbutton assessment to 45 seconds or less.  Baseline: 54.56 sec Goal status: in progress  4.  Pt will write a 10-15 item grocery list with 100% legibility and good adherence to lines and spacing. Baseline: 80% legibility; pt writing (whales live in a blue ocean) in 30.85 seconds with large sizing and no regard to lined paper, some letters way above and some below lines. Goal status: INITIAL  5.  Patient will report at least two-point increase in average PSFS score or at least three-point increase in a single activity score indicating functionally significant improvement given minimum detectable change. Baseline: 1.3 Goal status: INITIAL   ASSESSMENT:  CLINICAL IMPRESSION: Pt demo'd elevated BP at beginning of session though asymptomatic throughout session. Pt tolerated FM tasks fairly well with some BUE FM difficulty, especially with clothing fasteners of buttons and tying shoes.  Pt with improved motor control when items placed at table top for massed practice vs shoe on floor with attempts on personal shoe.  Noted that pt demo'd increased difficulty with LUE coordination compared to RUE. Pt benefited from verbal encouragement throughout session. Pt benefited from v/c and education regarding upright  seated and standing posture to improve proximal stability for distal mobility and to improve safety.   Pt will benefit from skilled occupational therapy services to address coordination, pain management, altered sensation, balance, GM/FM control, cognition, safety awareness, introduction of compensatory strategies/AE prn, visual-perception, and implementation of an HEP to improve participation and safety during ADLs and IADLs.    PERFORMANCE DEFICITS: in functional skills including ADLs, IADLs, coordination, proprioception, sensation, pain, Fine motor control, Gross motor  control, balance, body mechanics, endurance, decreased knowledge of precautions, decreased knowledge of use of DME, vision, and UE functional use and psychosocial skills including coping strategies, environmental adaptation, and routines and behaviors.    PLAN:  OT FREQUENCY: 1-2x/week  OT DURATION: 6 weeks  PLANNED INTERVENTIONS: 97168 OT Re-evaluation, 97535 self care/ADL training, 91478 therapeutic exercise, 97530 therapeutic activity, 97112 neuromuscular re-education, functional mobility training, visual/perceptual remediation/compensation, psychosocial skills training, energy conservation, coping strategies training, patient/family education, and DME and/or AE instructions  RECOMMENDED OTHER SERVICES: may benefit from SLP evaluation due to report of swallowing issues  CONSULTED AND AGREED WITH PLAN OF CARE: Patient  PLAN FOR NEXT SESSION:  Monitor BP  Review FM coordination HEP PRN  initiate  [facility] HEP  WB activities for motor control  education on AE/DME   Anthonette Kinsman, OTR/L 09/02/2023, 3:49 PM   [facility] at [facility] [address] [place], [place] [address] Phone # [phone] Fax # [phone]

## 2023-09-04 ENCOUNTER — Encounter: Payer: Self-pay | Admitting: Rehabilitative and Restorative Service Providers"

## 2023-09-04 ENCOUNTER — Ambulatory Visit: Admitting: Occupational Therapy

## 2023-09-04 ENCOUNTER — Ambulatory Visit: Admitting: Rehabilitative and Restorative Service Providers"

## 2023-09-04 DIAGNOSIS — R41842 Visuospatial deficit: Secondary | ICD-10-CM

## 2023-09-04 DIAGNOSIS — R2681 Unsteadiness on feet: Secondary | ICD-10-CM

## 2023-09-04 DIAGNOSIS — R278 Other lack of coordination: Secondary | ICD-10-CM

## 2023-09-04 DIAGNOSIS — R208 Other disturbances of skin sensation: Secondary | ICD-10-CM

## 2023-09-04 DIAGNOSIS — R293 Abnormal posture: Secondary | ICD-10-CM

## 2023-09-04 DIAGNOSIS — M6281 Muscle weakness (generalized): Secondary | ICD-10-CM

## 2023-09-04 DIAGNOSIS — R270 Ataxia, unspecified: Secondary | ICD-10-CM | POA: Diagnosis not present

## 2023-09-04 DIAGNOSIS — R2689 Other abnormalities of gait and mobility: Secondary | ICD-10-CM

## 2023-09-04 NOTE — Therapy (Signed)
 OUTPATIENT PHYSICAL THERAPY NEURO TREATMENT   Patient Name: Jonathan Hopkins MRN: [id number] DOB:[birth year], 38 y.o., male Today's Date: 09/04/2023   PCP: Fonda Hymen, NP REFERRING PROVIDER: Charmel Cooter, MD   END OF SESSION:  PT End of Session - 09/04/23 1316     Visit Number 4    Number of Visits 13    Date for PT Re-Evaluation 09/26/23    Authorization Type Medicare Part A&B / [place] Medicaid 2025  Auth: NOT REQUIRED due to Medicare primary  VL: 27 visits combined    Authorization - Visit Number 4    Progress Note Due on Visit 27    PT Start Time 1408    PT Stop Time 1449    PT Time Calculation (min) 41 min    Equipment Utilized During Treatment Gait belt    Activity Tolerance Patient tolerated treatment well    Behavior During Therapy WFL for tasks assessed/performed             Past Medical History:  Diagnosis Date   ADHD    Gait abnormality 01/19/2019   Hypertension    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   Past Surgical History:  Procedure Laterality Date   LAPAROSCOPIC APPENDECTOMY N/A 12/06/2021   Procedure: APPENDECTOMY LAPAROSCOPIC;  Surgeon: Oralee Billow, MD;  Location: [facility];  Service: General;  Laterality: N/A;   THYROID SURGERY     WISDOM TOOTH EXTRACTION     x4   Patient Active Problem List   Diagnosis Date Noted   Frequent falls  R Gluteal Tears 07/24/2023   Abnormality of femur 07/24/2023   Anemia 07/24/2023   Chronic health problem 07/24/2023   Appendicitis 12/05/2021   Gait abnormality 01/19/2019   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    ONSET DATE: 07/25/2023 MD referral REFERRING DIAG: M79.89 (ICD-10-CM) - Right leg swelling  THERAPY DIAG:  Muscle weakness (generalized)  Unsteadiness on feet  Abnormal posture  Ataxia  Other disturbances of skin sensation  Other abnormalities of gait and mobility  Rationale for Evaluation and Treatment: Rehabilitation  SUBJECTIVE:                                                                                                                                                                                              SUBJECTIVE STATEMENT: The patient reports that he continues with knee and R hip tightness and pain.  The patient got ankle weights -- he thinks they are adjustable and go up to 10# each. He tried with exercise with full resistance.  Pt accompanied by: self  PERTINENT HISTORY: PMH  positive for spinal cerebellar ataxia, ADHD, HTN, anxiety/MDD, insomina.38 y/o male admitted to [facility] 07/23/23 with R LE pain after recent increase in falls. MRI positive for R gluteal muscle tears with plan for WBAT potential operative repair, d/c with recommendation to f/u with orthopedics in 1 week. (Pt reports he has not had follow-up)  PAIN:  Are you having pain? Yes: NPRS scale: 3-4/10 Pain location: R buttocks, lateral aspect of knee Pain description: shooting pain Aggravating factors: bending down aggravates butt and hamstrings; fully straightening knee Relieving factors: getting out of those positions  PRECAUTIONS: Fall and Other: Pt states no restrictions that he knows of; pt unaware of any follow-up  WEIGHT BEARING RESTRICTIONS: No  FALLS: Has patient fallen in last 6 months? Yes. Number of falls 1 fall since hospitalization in April; prior to that, 3-4/day  LIVING ENVIRONMENT: Lives with: lives with their family Lives in: House/apartment Stairs: 1 step into home  Has following equipment at home: Single point cane, Environmental consultant - 2 wheeled, shower chair, and Grab bars  PLOF: Independent with household mobility with device  PATIENT GOALS: Pt's goals are to improve strength and coordination  OBJECTIVE:   [facility] Adult PT Treatment:                                                DATE: 09/04/23 BP=130/77 HR=93 Therapeutic Exercise: Quadriped Hip extension attempted-- however pain in R UE with load so modified to plank Prone Prone on  elbows Plank position with extended arms-- pain in back Plank on knees x 5 reps with 5 second holds Sidelying Hip abduction x 10 reps with cues for hip positioning and to reduce rolling (compensating with hip flexors) Clam shells x 10 reps Supine Bridging with HS emphasis with toes raised x 10 reps Stretching R lateral hip with piriformis stretch-- feels no tightness R hip with stretching Standing Heel raises x 10 reps Heel raises with knee flexion x 10 reps Mini squat Partial sit<.stands to grade movement Gait: Gait with RW with 3# weights on RW x 75 feet in clinic x 2 reps Patient initially reported that he bought 10# weights for his rollator-- we initially trialed this and he noted it is not that heavy  PATIENT EDUCATION: Education details: HEP, discussed ankle weights for home to use for strengthening and possibly to use to help with stability with gait Person educated: Patient Education method: Explanation, Demonstration, Tactile cues, Verbal cues, and Handouts Education comprehension: verbalized understanding  HOME EXERCISE PROGRAM: Access Code: [email or web address] URL: [email or web address] Date: 09/02/2023 Prepared by: [facility] - Outpatient  Rehab - [facility]  Program Notes *Look for adjustable weight ankle weights (2-10#)[employer] is an example of a good brand  Exercises - Seated Gluteal Sets  - 1-2 x daily - 7 x weekly - 2 sets - 10 reps - 3 sec hold - Sitting Knee Extension with Resistance  - 1 x daily - 4 x weekly - 3 sets - 10 reps - Seated Hamstring Curl with Anchored Resistance  - 1 x daily - 4 x weekly - 3 sets - 10 reps  ____________________________________________________________________________________________ Note: Objective measures were completed at Evaluation unless otherwise noted.  DIAGNOSTIC FINDINGS: No acute fractures  COGNITION: Overall cognitive status: Within functional limits for tasks assessed   SENSATION: Light touch:  WFL  COORDINATION: Slowed alternating foot taps  EDEMA:  Mild  swelling noted R ankle  PALPATION:  Along R IT band, along lateral aspect of R knee Ligament tests negative for pain  POSTURE: rounded shoulders, forward head, and flexed trunk   LOWER EXTREMITY ROM:    Active  Right Eval Left Eval  Hip flexion    Hip extension    Hip abduction    Hip adduction    Hip internal rotation    Hip external rotation    Knee flexion 115 with pain (sitting)   Knee extension Supine 90-90:  -10 degrees   Ankle dorsiflexion    Ankle plantarflexion    Ankle inversion    Ankle eversion     (Blank rows = not tested)  LOWER EXTREMITY MMT:   MMT Right Eval Left Eval  Hip flexion 4+ 5  Hip extension 3+ 4  Hip abduction 3+   Hip adduction    Hip internal rotation    Hip external rotation    Knee flexion 4+ 5  Knee extension 5 5  Ankle dorsiflexion    Ankle plantarflexion    Ankle inversion    Ankle eversion    (Blank rows = not tested)  TRANSFERS: Sit to stand: Modified independence  Assistive device utilized: None     Stand to sit: Modified independence  Assistive device utilized: decreased eccentric control and None      GAIT: Findings: Gait Characteristics: step through pattern, decreased step length- Right, decreased step length- Left, decreased hip/knee flexion- Right, decreased hip/knee flexion- Left, decreased ankle dorsiflexion- Right, decreased ankle dorsiflexion- Left, ataxic, and wide BOS, Distance walked: 50 ft x 2, Assistive device utilized:Walker - 2 wheeled, Level of assistance: SBA and CGA, and Comments: Pt has increased weightshift and LOB towards R side  FUNCTIONAL TESTS:  5 times sit to stand: 37.72 sec 10 meter walk test: 27.69 sec with RW and supervision (1.18 ft/sec)  GOALS: Goals reviewed with patient? Yes  SHORT TERM GOALS: Target date: 08/29/2023  Pt will be independent with HEP for improved strength, flexibility. Baseline:  No current HEP; initiated  09/02/2023 Goal status: IN PROGRESS  2.  Pt will improve 5x sit<>stand to less than or equal to 30sec to demonstrate improved functional strength and transfer efficiency. Baseline: 37.72 sec Goal status: MET-28.75 seconds  LONG TERM GOALS: Target date: 09/26/2023  Pt will be independent with HEP for improved strength, balance, gait, flexibility, pain. Baseline:  No current HEP Goal status:IN PROGRESS  2.  Pt will improve 5x sit<>stand to less than or equal to 20sec to demonstrate improved functional strength and transfer efficiency. Baseline: 37.72 sec Goal status: IN PROGRESS  3.  Pt will demo improved knee flexion in painfree range by at least 10 degrees. Baseline: 115 degrees in sitting, with pain Goal status: IN PROGRESS  4.  Pt will improve gait velocity to at least 1.8 ft/sec for improved gait efficiency and safety. Baseline: 1.18 ft/sec Goal status: IN PROGRESS  5.  Pt will verbalize understanding of fall prevention in home environment. Baseline: was falling 3-4 times/day prior to hospitalization Goal status: IN PROGRESS   ASSESSMENT:  CLINICAL IMPRESSION: The patient is doing HEP from last session and purchased weights to weight his rollator in the home. He tolerated strengthening of R hip without pain during today's session and PT worked on glut med and glut max + general LE activities. Plan to continue to progress to patient tolerance. He met STG # 2 for 5 time sit<>stand today.    OBJECTIVE IMPAIRMENTS: Abnormal gait,  decreased balance, decreased knowledge of use of DME, decreased mobility, difficulty walking, decreased ROM, decreased strength, impaired flexibility, postural dysfunction, and pain.   PLAN:  PT FREQUENCY: 2x/week  PT DURATION: 6 weeks plus eval  PLANNED INTERVENTIONS: 97750- Physical Performance Testing, 97110-Therapeutic exercises, 97530- Therapeutic activity, 97112- Neuromuscular re-education, 97535- Self Care, 82956- Manual therapy, [phone]- Gait  training, Patient/Family education, Balance training, DME instructions, and Cryotherapy  PLAN FOR NEXT SESSION: monitor BP;  Review HEP for RLE ROM and gentle strength, balance/weigthshifting; gait training with RW (3# weights on walker), fall prevention, safety education.   Lucero Ide, PT 09/04/23 3:07 PM [facility] at [facility] [address] [place], [address] [place], [place] [address] Phone # [phone] Fax # [phone]

## 2023-09-04 NOTE — Therapy (Signed)
 OUTPATIENT OCCUPATIONAL THERAPY NEURO Treatment  Patient Name: Jonathan Hopkins MRN: [id number] DOB:Jan 12, 1986, 38 y.o., male Today's Date: 09/04/2023  PCP: Fonda Hymen, NP REFERRING PROVIDER: Charmel Cooter, MD  END OF SESSION:  OT End of Session - 09/04/23 1323     Visit Number 4    Number of Visits 11    Date for OT Re-Evaluation 09/26/23    Authorization Type Medicare [employer] / [place] [employer]    Authorization Time Period 27 visits combined    OT Start Time 1320    OT Stop Time 1400    OT Time Calculation (min) 40 min    Activity Tolerance Patient tolerated treatment well    Behavior During Therapy WFL for tasks assessed/performed                Past Medical History:  Diagnosis Date   ADHD    Gait abnormality 01/19/2019   Hypertension    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   Past Surgical History:  Procedure Laterality Date   LAPAROSCOPIC APPENDECTOMY N/A 12/06/2021   Procedure: APPENDECTOMY LAPAROSCOPIC;  Surgeon: Oralee Billow, MD;  Location: [facility];  Service: General;  Laterality: N/A;   THYROID SURGERY     WISDOM TOOTH EXTRACTION     x4   Patient Active Problem List   Diagnosis Date Noted   Frequent falls  R Gluteal Tears 07/24/2023   Abnormality of femur 07/24/2023   Anemia 07/24/2023   Chronic health problem 07/24/2023   Appendicitis 12/05/2021   Gait abnormality 01/19/2019   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  Pt accompanied by: self (pt drove self)  PERTINENT HISTORY: PMH positive for spinal cerebellar ataxia,  ADHD, HTN, anxiety/MDD, insomina.38 y/o male admitted to [facility] 07/23/23 with R LE pain after recent increase in falls. MRI positive for R gluteal muscle tears with plan for WBAT potential operative repair, d/c with recommendation to f/u with orthopedics in 1 week.  PRECAUTIONS: Fall  WEIGHT BEARING RESTRICTIONS: No, WBAT  PAIN:  Are you having pain? Yes: NPRS scale: 3-4/10 Pain location: R glute, hamstring, knee Pain description: sharp Aggravating factors: extending R leg and prolonged standing Relieving factors: sitting, bending knee  FALLS: Has patient fallen in last 6 months? Yes. Number of falls reports falling multiple times per day  08/27/23 - Pt reported fall last night 08/26/23 d/t falling backwards while up and walking. Pt had scrapes on L leg and reported only injury from fall. Pt reported able to get up by himself.  LIVING ENVIRONMENT: Lives with: lives with their family (sister and niece) Lives in: House/apartment Stairs: 1 step into home  Has following equipment at home: Otho Blitz - 2 wheeled, Environmental consultant - 4 wheeled, shower chair, Grab bars, and non skid floor in shower  PLOF: Independent and Independent with basic ADLs  PATIENT GOALS: [facility] skills to open food containers and separate coffee filters, button pants  OBJECTIVE:  Note: Objective measures were completed at Evaluation unless otherwise noted.  HAND DOMINANCE: Right  ADLs: Transfers/ambulation related to ADLs: Mod I with RW, reports frequent falls Eating: difficulty with cutting and scooping foods,  Grooming:  decreased motor control when brushing teeth and shaving UB Dressing: Mod I, in sitting LB Dressing: difficulty with buttoning pants, belt, tying shoes (tends to just slip shoes on/off) Toileting: Mod I - has a grab bar next to toilet Bathing: Mod I - using shower chair and grab bars Tub Shower transfers: Mod I - using grab bars Equipment: Shower seat with back, Grab bars, and Walk in shower  IADLs: Light  housekeeping: "challenging" as he was using a cane before recent hospitalization Meal Prep: uses microwave and makes smaller meals Community mobility: driving Medication management: Mod I - utilizing pill box Handwriting: 80% legibility; pt writing ([place] live [place]) in 30.85 seconds with large sizing and no regard to lined paper, some letters way above and some below lines.  MOBILITY STATUS: Needs Assist: CGA to Min A with ambulation from treatment room to front of clinic due to 2 LOB and Hx of falls  POSTURE COMMENTS:  rounded shoulders and forward head  ACTIVITY TOLERANCE: Activity tolerance: WFL for tasks assessed on eval  FUNCTIONAL OUTCOME MEASURES: PSFS  UPPER EXTREMITY ROM:  WFL bilaterally, pt with mild delayed speed and proprioception on LUE   HAND FUNCTION: Grip strength: Right: 100 lbs; Left: 95 lbs  COORDINATION: Finger Nose Finger test: dysmetria bilaterally L> R 9 Hole Peg test: Right: 81.06 sec; Left: 89.12 sec - pt dropping 1 peg on L, 2 on R Box and Blocks:  Right 23 blocks, Left 19 blocks RAM: symmetrical when completed slowly, disdiadochokinesia with RAM.  SENSATION: Numbness/tingling through LUE  COGNITION: Overall cognitive status: Within functional limits for tasks assessed  VISION: Subjective report: double vision, wears glasses with prisms to decrease double vision Baseline vision: Wears glasses all the time  VISION ASSESSMENT: To be further assessed in functional context  PRAXIS: Impaired: Motor planning  OBSERVATIONS: Pt required Min A with ambulation with RW due to multiple LOB.  Pt demonstrating significant difficulty with bilateral tasks and coordination, LUE> RUE.                                                                                                                             TREATMENT DATE:  09/04/23 BP 126/79 at beginning of session GMC:  engaged in picking up large jacks and placing into same color cones with RUE  and LUE with focus on gross and fine motor control.  Pt completing with RUE, with increased ataxia when reaching across midline and when pouring out cones.  Pt with increased difficulty when completing with LUE, however when cued to visually attend to task pt with min increase in control.  Pt knocking over stacks of cones initially due to ataxia and decreased attention to task,  Engaged in moving rings from one cone to the other with RUE and LUE with challenge to not touch the sides of the cones.  Pt bumping into sides ~35% of time with RUE and ~50% with LUE, however not knocking over  stacks of cones. FMC: engaged in picking up coins one at a time with RUE and LUE and placing into coin slot.  Pt with increased difficulty picking up nickels from table top.  OT providing education on placing a wash cloth or towel under small items to allow for friction as coins initially sliding on table top.  OT also recommending attempting to pick up smaller coins before larger to see if fatigue playing a part in increased challenge with picking up smaller coins.  Transitioned to picking up variety of small items from wash cloth to challenge motor control, while still allowing for ease with picking up.    09/02/23 BP 131/96 at beginning of session. Tying shoes: engaged in massed practice with tying shoes with tissue box shoe example.  Pt demonstrating difficulty with sustaining grasp and manipulation of laces with L hand.  OT educating on 2 bunny ear option as well as securing ends of laces and completing square knot option to increase ease with tying shoes.  Attempted square knot option on personal shoe, however due to larger holes at laces unable to secure laces.  OT providing cues to visually attend to task to increase motor control and to continue to attempt alternative strategies to see if one may work better for pt. Buttons: engaged in buttoning/unbuttoning 3 buttons with difficulty with buttoning due to decreased  coordination L >R UE.  OT educating pt on use of button hook to fasten buttons.  Pt demonstrating significant improvements with use of button hook.  OT providing recommendation for purchase options if desired.   Coordination: OT reiterating engagement in HEP provided during previous session with pt stating that he had lost the paperwork.  OT providing additional handout and encouraging completing a few at a time 2-3x/day for continued focus on motor control.   PATIENT EDUCATION: Education details: see today's treatment above Person educated: Patient Education method: Explanation and Handouts Education comprehension: verbalized understanding and needs further education  HOME EXERCISE PROGRAM: 08/27/23 - FM coordination HEP (see pt instructions, handout provided)   GOALS: Goals reviewed with patient? Yes  SHORT TERM GOALS: Target date: 09/05/23  Pt will be independent with [facility] and [facility] HEP with use of visual handouts. Baseline: new to OPOT Goal status: in progress  2.  Pt will verbalize understanding of recommendations for adaptive strategies, AE/DME, and/or modifications to home setup to increase independence and safety with ability to complete ADLs.  Baseline: pt with frequent falls Goal status: in progress  3.  Pt will recall at least 3 sensory safety precautions and compensations to improve safety during functional tasks.  Baseline: impaired sensation LUE> RUE Goal status: INITIAL   LONG TERM GOALS: Target date: 09/26/23  Pt will demonstrate improved gross motor coordination to increase independence with ADLs and IADLs as evidenced by improved score on Box and Blocks assessment by 8 blocks bilaterally.  Baseline: Right 23 blocks, Left 19 blocks Goal status: in progress  2.  Pt will demonstrate improved fine motor coordination for ADLs as evidenced by decreasing 9 hole peg test score by 10 secs bilaterally. Baseline: Right: 81.06 sec; Left: 89.12 sec - pt dropping 1 peg on L, 2 on  R Goal status: in progress  3.  Pt will report and/or demonstrate improved coordination to manage clothing fasteners (pants button) by decreasing score on 3 button/unbutton assessment to 45 seconds or less.  Baseline: 54.56 sec Goal status: in progress  4.  Pt will write a 10-15 item grocery list with  100% legibility and good adherence to lines and spacing. Baseline: 80% legibility; pt writing (whales live in [place] ocean) in 30.85 seconds with large sizing and no regard to lined paper, some letters way above and some below lines. Goal status: INITIAL  5.  Patient will report at least two-point increase in average PSFS score or at least three-point increase in a single activity score indicating functionally significant improvement given minimum detectable change. Baseline: 1.3 Goal status: INITIAL   ASSESSMENT:  CLINICAL IMPRESSION: Pt tolerated FM tasks fairly well with some BUE FM difficulty, especially with attempting to pick up coins from flat table top.  OT providing education on adaptive strategies with use of towel to provide friction and cues to visually attend to UE during mobility.  Noted that pt demo'd increased difficulty with LUE coordination compared to RUE. Pt benefits from verbal encouragement throughout session. Pt benefited from v/c and education regarding upright seated and standing posture to improve proximal stability for distal mobility and to improve safety.   Pt will benefit from skilled occupational therapy services to address coordination, pain management, altered sensation, balance, GM/FM control, cognition, safety awareness, introduction of compensatory strategies/AE prn, visual-perception, and implementation of an HEP to improve participation and safety during ADLs and IADLs.    PERFORMANCE DEFICITS: in functional skills including ADLs, IADLs, coordination, proprioception, sensation, pain, Fine motor control, Gross motor control, balance, body mechanics,  endurance, decreased knowledge of precautions, decreased knowledge of use of DME, vision, and UE functional use and psychosocial skills including coping strategies, environmental adaptation, and routines and behaviors.    PLAN:  OT FREQUENCY: 1-2x/week  OT DURATION: 6 weeks  PLANNED INTERVENTIONS: 97168 OT Re-evaluation, 97535 self care/ADL training, 35573 therapeutic exercise, 97530 therapeutic activity, 97112 neuromuscular re-education, functional mobility training, visual/perceptual remediation/compensation, psychosocial skills training, energy conservation, coping strategies training, patient/family education, and DME and/or AE instructions  RECOMMENDED OTHER SERVICES: may benefit from SLP evaluation due to report of swallowing issues  CONSULTED AND AGREED WITH PLAN OF CARE: Patient  PLAN FOR NEXT SESSION:  Monitor BP  Review FM coordination HEP PRN  initiate  GMC HEP  WB activities for motor control  education on AE/DME   Anthonette Kinsman, OTR/L 09/04/2023, 3:50 PM   [facility] at [facility] [address], [address] [place], [place] [address] Phone # [phone] Fax # [phone]

## 2023-09-09 ENCOUNTER — Ambulatory Visit: Admitting: Physical Therapy

## 2023-09-09 ENCOUNTER — Encounter: Payer: Self-pay | Admitting: Physical Therapy

## 2023-09-09 ENCOUNTER — Ambulatory Visit: Admitting: Occupational Therapy

## 2023-09-09 DIAGNOSIS — M6281 Muscle weakness (generalized): Secondary | ICD-10-CM

## 2023-09-09 DIAGNOSIS — R278 Other lack of coordination: Secondary | ICD-10-CM

## 2023-09-09 DIAGNOSIS — R208 Other disturbances of skin sensation: Secondary | ICD-10-CM

## 2023-09-09 DIAGNOSIS — R2681 Unsteadiness on feet: Secondary | ICD-10-CM

## 2023-09-09 DIAGNOSIS — R270 Ataxia, unspecified: Secondary | ICD-10-CM | POA: Diagnosis not present

## 2023-09-09 NOTE — Therapy (Signed)
 OUTPATIENT OCCUPATIONAL THERAPY NEURO Treatment  Patient Name: Jonathan Hopkins MRN: [id number] DOB:[birth year], 38 y.o., male Today's Date: 09/09/2023  PCP: Fonda Hymen, NP REFERRING PROVIDER: Charmel Cooter, MD  END OF SESSION:  OT End of Session - 09/09/23 1323     Visit Number 5    Number of Visits 11    Date for OT Re-Evaluation 09/26/23    Authorization Type Medicare A&B / [place] Medicaid    Authorization Time Period 27 visits combined    OT Start Time 1320    OT Stop Time 1400    OT Time Calculation (min) 40 min    Activity Tolerance Patient tolerated treatment well    Behavior During Therapy WFL for tasks assessed/performed                Past Medical History:  Diagnosis Date   ADHD    Gait abnormality 01/19/2019   Hypertension    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   Past Surgical History:  Procedure Laterality Date   LAPAROSCOPIC APPENDECTOMY N/A 12/06/2021   Procedure: APPENDECTOMY LAPAROSCOPIC;  Surgeon: Oralee Billow, MD;  Location: [facility];  Service: General;  Laterality: N/A;   THYROID SURGERY     WISDOM TOOTH EXTRACTION     x4   Patient Active Problem List   Diagnosis Date Noted   Frequent falls  R Gluteal Tears 07/24/2023   Abnormality of femur 07/24/2023   Anemia 07/24/2023   Chronic health problem 07/24/2023   Appendicitis 12/05/2021   Gait abnormality 01/19/2019   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  Pt accompanied by: self (pt drove self)  PERTINENT HISTORY: PMH positive for spinal cerebellar ataxia, ADHD, HTN, anxiety/MDD,  insomina.38 y/o male admitted to [facility] 07/23/23 with R LE pain after recent increase in falls. MRI positive for R gluteal muscle tears with plan for WBAT potential operative repair, d/c with recommendation to f/u with orthopedics in 1 week.  PRECAUTIONS: Fall  WEIGHT BEARING RESTRICTIONS: No, WBAT  PAIN:  Are you having pain? Yes: NPRS scale: 3-4/10 Pain location: R glute, hamstring, knee Pain description: sharp Aggravating factors: extending R leg and prolonged standing Relieving factors: sitting, bending knee  FALLS: Has patient fallen in last 6 months? Yes. Number of falls reports falling multiple times per day  08/27/23 - Pt reported fall last night 08/26/23 d/t falling backwards while up and walking. Pt had scrapes on L leg and reported only injury from fall. Pt reported able to get up by himself.  LIVING ENVIRONMENT: Lives with: lives with their family (sister and niece) Lives in: House/apartment Stairs: 1 step into home  Has following equipment at home: Otho Blitz - 2 wheeled, Environmental consultant - 4 wheeled, shower chair, Grab bars, and non skid floor in shower  PLOF: Independent and Independent with basic ADLs  PATIENT GOALS: [facility] skills to open food containers and separate coffee filters, button pants  OBJECTIVE:  Note: Objective measures were completed at Evaluation unless otherwise noted.  HAND DOMINANCE: Right  ADLs: Transfers/ambulation related to ADLs: Mod I with RW, reports frequent falls Eating: difficulty with cutting and scooping foods,  Grooming: decreased motor control  when brushing teeth and shaving UB Dressing: Mod I, in sitting LB Dressing: difficulty with buttoning pants, belt, tying shoes (tends to just slip shoes on/off) Toileting: Mod I - has a grab bar next to toilet Bathing: Mod I - using shower chair and grab bars Tub Shower transfers: Mod I - using grab bars Equipment: Shower seat with back, Grab bars, and Walk in shower  IADLs: Light housekeeping: "challenging"  as he was using a cane before recent hospitalization Meal Prep: uses microwave and makes smaller meals Community mobility: driving Medication management: Mod I - utilizing pill box Handwriting: 80% legibility; pt writing (whales live in [place] blue ocean) in 30.85 seconds with large sizing and no regard to lined paper, some letters way above and some below lines.  MOBILITY STATUS: Needs Assist: CGA to Min A with ambulation from treatment room to front of clinic due to 2 LOB and Hx of falls  POSTURE COMMENTS:  rounded shoulders and forward head  ACTIVITY TOLERANCE: Activity tolerance: WFL for tasks assessed on eval  FUNCTIONAL OUTCOME MEASURES: PSFS  UPPER EXTREMITY ROM:  WFL bilaterally, pt with mild delayed speed and proprioception on LUE   HAND FUNCTION: Grip strength: Right: 100 lbs; Left: 95 lbs  COORDINATION: Finger Nose Finger test: dysmetria bilaterally L> R 9 Hole Peg test: Right: 81.06 sec; Left: 89.12 sec - pt dropping 1 peg on L, 2 on R Box and Blocks:  Right 23 blocks, Left 19 blocks RAM: symmetrical when completed slowly, disdiadochokinesia with RAM.  SENSATION: Numbness/tingling through LUE  COGNITION: Overall cognitive status: Within functional limits for tasks assessed  VISION: Subjective report: double vision, wears glasses with prisms to decrease double vision Baseline vision: Wears glasses all the time  VISION ASSESSMENT: To be further assessed in functional context  PRAXIS: Impaired: Motor planning  OBSERVATIONS: Pt required Min A with ambulation with RW due to multiple LOB.  Pt demonstrating significant difficulty with bilateral tasks and coordination, LUE> RUE.                                                                                                                             TREATMENT DATE:  09/09/23 Box and blocks: L: 23 blocks and R: 30 blocks FMC: engaged in small peg board pattern replication with use of RUE and LUE with focus on picking  up small pegs and placing into pegboard.  Pt demonstrating improved ability to pick up from mixed pegs vs from table top with RUE, however benefiting from placement of peg onto grid with LUE and then readjusting pinch grip.  Pt demonstrating increased difficulty with LUE > RUE.  OT then challenging pt to remove 5-8 pegs with RUE and then LUE and translate finger tips to hold in palm.  Pt with max difficulty with translation, utilizing opposite hand or chest to stabilize and transfer to palm of hand with RUE, demonstrating improved ease with translation with LUE. GMC: placing golf balls on top of  cones with use of tongs with RUE and LUE.  Pt demonstrating some increased shoulder abduction bilaterally in attempts to compensate for decreased motor control.  OT educating on attempts at reaching with keeping arms closer to side to improve motor control.  Pt with increased difficulty with LUE > RUE.    09/04/23 BP 126/79 at beginning of session GMC:  engaged in picking up large jacks and placing into same color cones with RUE and LUE with focus on gross and fine motor control.  Pt completing with RUE, with increased ataxia when reaching across midline and when pouring out cones.  Pt with increased difficulty when completing with LUE, however when cued to visually attend to task pt with min increase in control.  Pt knocking over stacks of cones initially due to ataxia and decreased attention to task,  Engaged in moving rings from one cone to the other with RUE and LUE with challenge to not touch the sides of the cones.  Pt bumping into sides ~35% of time with RUE and ~50% with LUE, however not knocking over stacks of cones. FMC: engaged in picking up coins one at a time with RUE and LUE and placing into coin slot.  Pt with increased difficulty picking up nickels from table top.  OT providing education on placing a wash cloth or towel under small items to allow for friction as coins initially sliding on table top.   OT also recommending attempting to pick up smaller coins before larger to see if fatigue playing a part in increased challenge with picking up smaller coins.  Transitioned to picking up variety of small items from wash cloth to challenge motor control, while still allowing for ease with picking up.    09/02/23 BP 131/96 at beginning of session. Tying shoes: engaged in massed practice with tying shoes with tissue box shoe example.  Pt demonstrating difficulty with sustaining grasp and manipulation of laces with L hand.  OT educating on 2 bunny ear option as well as securing ends of laces and completing square knot option to increase ease with tying shoes.  Attempted square knot option on personal shoe, however due to larger holes at laces unable to secure laces.  OT providing cues to visually attend to task to increase motor control and to continue to attempt alternative strategies to see if one may work better for pt. Buttons: engaged in buttoning/unbuttoning 3 buttons with difficulty with buttoning due to decreased coordination L >R UE.  OT educating pt on use of button hook to fasten buttons.  Pt demonstrating significant improvements with use of button hook.  OT providing recommendation for purchase options if desired.   Coordination: OT reiterating engagement in HEP provided during previous session with pt stating that he had lost the paperwork.  OT providing additional handout and encouraging completing a few at a time 2-3x/day for continued focus on motor control.   PATIENT EDUCATION: Education details: see today's treatment above Person educated: Patient Education method: Explanation and Handouts Education comprehension: verbalized understanding and needs further education  HOME EXERCISE PROGRAM: 08/27/23 - FM coordination HEP (see pt instructions, handout provided)   GOALS: Goals reviewed with patient? Yes  SHORT TERM GOALS: Target date: 09/05/23  Pt will be independent with [facility] and GMC  HEP with use of visual handouts. Baseline: new to OPOT Goal status: in progress  2.  Pt will verbalize understanding of recommendations for adaptive strategies, AE/DME, and/or modifications to home setup to increase independence and safety with  ability to complete ADLs.  Baseline: pt with frequent falls Goal status: in progress  3.  Pt will recall at least 3 sensory safety precautions and compensations to improve safety during functional tasks.  Baseline: impaired sensation LUE> RUE Goal status: INITIAL   LONG TERM GOALS: Target date: 09/26/23  Pt will demonstrate improved gross motor coordination to increase independence with ADLs and IADLs as evidenced by improved score on Box and Blocks assessment by 8 blocks bilaterally.  Baseline: Right 23 blocks, Left 19 blocks 09/09/23: Left: 23 blocks, Right: 30 blocks Goal status: in progress  2.  Pt will demonstrate improved fine motor coordination for ADLs as evidenced by decreasing 9 hole peg test score by 10 secs bilaterally. Baseline: Right: 81.06 sec; Left: 89.12 sec - pt dropping 1 peg on L, 2 on R Goal status: in progress  3.  Pt will report and/or demonstrate improved coordination to manage clothing fasteners (pants button) by decreasing score on 3 button/unbutton assessment to 45 seconds or less.  Baseline: 54.56 sec Goal status: in progress  4.  Pt will write a 10-15 item grocery list with 100% legibility and good adherence to lines and spacing. Baseline: 80% legibility; pt writing (whales live in a blue ocean) in 30.85 seconds with large sizing and no regard to lined paper, some letters way above and some below lines. Goal status: INITIAL  5.  Patient will report at least two-point increase in average PSFS score or at least three-point increase in a single activity score indicating functionally significant improvement given minimum detectable change. Baseline: 1.3 Goal status: INITIAL   ASSESSMENT:  CLINICAL IMPRESSION: Pt  tolerated fine and gross motor tasks fairly well with more fine motor than gross motor difficulty, especially with small pegs.   Noted that pt demonstrates increased difficulty with LUE coordination compared to RUE. Pt benefits from verbal encouragement throughout session. Pt benefited from v/c and education regarding upright seated and standing posture as well as improved proximity of arms to torso during reaching.  Pt will continue to benefit from skilled occupational therapy services to address coordination, pain management, altered sensation, balance, GM/FM control, cognition, safety awareness, introduction of compensatory strategies/AE prn, visual-perception, and implementation of an HEP to improve participation and safety during ADLs and IADLs.    PERFORMANCE DEFICITS: in functional skills including ADLs, IADLs, coordination, proprioception, sensation, pain, Fine motor control, Gross motor control, balance, body mechanics, endurance, decreased knowledge of precautions, decreased knowledge of use of DME, vision, and UE functional use and psychosocial skills including coping strategies, environmental adaptation, and routines and behaviors.    PLAN:  OT FREQUENCY: 1-2x/week  OT DURATION: 6 weeks  PLANNED INTERVENTIONS: 97168 OT Re-evaluation, 97535 self care/ADL training, 16109 therapeutic exercise, 97530 therapeutic activity, 97112 neuromuscular re-education, functional mobility training, visual/perceptual remediation/compensation, psychosocial skills training, energy conservation, coping strategies training, patient/family education, and DME and/or AE instructions  RECOMMENDED OTHER SERVICES: may benefit from SLP evaluation due to report of swallowing issues  CONSULTED AND AGREED WITH PLAN OF CARE: Patient  PLAN FOR NEXT SESSION:  Monitor BP  Review FM coordination HEP PRN  initiate  GMC HEP  WB activities for motor control  education on AE/DME   Anthonette Kinsman, OTR/L 09/09/2023,  1:23 PM   [facility] at [facility] [address], [address] [place], [place] [address] Phone # [phone] Fax # [phone]

## 2023-09-09 NOTE — Therapy (Signed)
 OUTPATIENT PHYSICAL THERAPY NEURO TREATMENT   Patient Name: Jonathan Hopkins MRN: [id number] DOB:10-06-1985, 38 y.o., male Today's Date: 09/09/2023   PCP: Jonathan Hymen, NP REFERRING PROVIDER: Charmel Cooter, MD   END OF SESSION:  PT End of Session - 09/09/23 1222     Visit Number 5    Number of Visits 13    Date for PT Re-Evaluation 09/26/23    Authorization Type Medicare Part A&B / [place] Medicaid 2025  Auth: NOT REQUIRED due to Medicare primary  VL: 27 visits combined    Authorization - Visit Number 5    Progress Note Due on Visit 27    PT Start Time 1232    PT Stop Time 1314    PT Time Calculation (min) 42 min    Equipment Utilized During Treatment --    Activity Tolerance Patient tolerated treatment well    Behavior During Therapy WFL for tasks assessed/performed              Past Medical History:  Diagnosis Date   ADHD    Gait abnormality 01/19/2019   Hypertension    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   Past Surgical History:  Procedure Laterality Date   LAPAROSCOPIC APPENDECTOMY N/A 12/06/2021   Procedure: APPENDECTOMY LAPAROSCOPIC;  Surgeon: Oralee Billow, MD;  Location: [facility];  Service: General;  Laterality: N/A;   THYROID SURGERY     WISDOM TOOTH EXTRACTION     x4   Patient Active Problem List   Diagnosis Date Noted   Frequent falls  R Gluteal Tears 07/24/2023   Abnormality of femur 07/24/2023   Anemia 07/24/2023   Chronic health problem 07/24/2023   Appendicitis 12/05/2021   Gait abnormality 01/19/2019   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  Have tried putting the weight (about 5#) on the walker.   Pt accompanied by: self  PERTINENT HISTORY: PMH positive for spinal cerebellar ataxia, ADHD, HTN, anxiety/MDD, insomina.38 y/o male admitted to [facility] 07/23/23 with R LE pain after recent increase in falls. MRI positive for R gluteal muscle tears with  plan for WBAT potential operative repair, d/c with recommendation to f/u with orthopedics in 1 week. (Pt reports he has not had follow-up)  PAIN:  Are you having pain? Yes: NPRS scale: 0/10 buttocks, knee; 1/10 R Elbow Pain location: R elbow Pain description: sore, swollen Aggravating factors: fall Relieving factors: just giving it time  PRECAUTIONS: Fall and Other: Pt states no restrictions that he knows of; pt unaware of any follow-up  WEIGHT BEARING RESTRICTIONS: No  FALLS: Has patient fallen in last 6 months? Yes. Number of falls 1 fall since hospitalization in April; prior to that, 3-4/day  LIVING ENVIRONMENT: Lives with: lives with their family Lives in: House/apartment Stairs: 1 step into home  Has following equipment at home: Single point cane, Environmental consultant - 2 wheeled, shower chair, and Grab bars  PLOF: Independent with household mobility with device  PATIENT GOALS: Pt's goals are to improve strength and coordination  OBJECTIVE:    TODAY'S TREATMENT: 09/09/2023 Activity Comments  Vitals 120/82, HR 9=104   Sidelying hip abduction  2 x10 Sidelying clamshells, 2 x 10 No weight, then 3#, cues to prevent hip flexion compensation 3#  Bridging with HS emphasis with toes raised 2 x 10 reps   RLE piriformis stretch, 2 x 10 sec No pain  Sit to stand, 2 x 5 reps From mat surface, UE support and cues for control  Minisquats x 10 Cues for technique  Short distance gait in clinic with 3# weights on RW Slowed pace, good attention to staying in  walker BOS   HOME EXERCISE PROGRAM: Access Code: [email or web address] URL: [email or web address] Date: 09/09/2023 Prepared by: [facility] - Outpatient  Rehab - [facility]  Program Notes *Look for adjustable weight ankle weights (2-10#)[employer] is an example of a good brand  Exercises - Seated Gluteal Sets  - 1-2 x daily - 7 x weekly - 2 sets - 10 reps - 3 sec hold - Sitting Knee Extension with Resistance  - 1 x daily - 4 x weekly - 3 sets - 10 reps - Seated Hamstring Curl with Anchored Resistance  - 1 x daily - 4 x weekly - 3 sets - 10 reps - Clamshell  - 1 x daily - 7 x weekly - 3 sets - 10 reps - 3 sec hold  PATIENT EDUCATION: Education details: Educated on signs/symptoms of further issues with elbow such as infection and when to contact MD; updates to HEP Person educated: Patient Education method: Programmer, multimedia, Demonstration, Tactile cues, Verbal cues, and Handouts Education comprehension: verbalized understanding, returned demonstration, and needs further education   ____________________________________________________________________________________________ Note: Objective measures were completed at Evaluation unless otherwise noted.  DIAGNOSTIC FINDINGS: No acute fractures  COGNITION: Overall cognitive status: Within functional limits for tasks assessed   SENSATION: Light touch: WFL  COORDINATION: Slowed alternating foot taps  EDEMA:  Mild swelling noted R ankle  PALPATION:  Along R IT band, along lateral aspect of R knee Ligament tests negative for pain  POSTURE: rounded shoulders, forward head, and flexed trunk   LOWER EXTREMITY ROM:    Active  Right Eval Left Eval  Hip flexion    Hip extension    Hip abduction    Hip adduction    Hip internal rotation    Hip external rotation    Knee flexion 115 with pain (sitting)   Knee extension Supine 90-90:  -10 degrees   Ankle dorsiflexion    Ankle plantarflexion    Ankle inversion    Ankle eversion      (  Blank rows = not tested)  LOWER EXTREMITY MMT:   MMT Right Eval Left Eval  Hip flexion 4+ 5  Hip extension 3+ 4  Hip abduction 3+   Hip adduction    Hip internal rotation    Hip external rotation    Knee flexion 4+ 5  Knee extension 5 5  Ankle dorsiflexion    Ankle plantarflexion    Ankle inversion    Ankle eversion    (Blank rows = not tested)  TRANSFERS: Sit to stand: Modified independence  Assistive device utilized: None     Stand to sit: Modified independence  Assistive device utilized: decreased eccentric control and None      GAIT: Findings: Gait Characteristics: step through pattern, decreased step length- Right, decreased step length- Left, decreased hip/knee flexion- Right, decreased hip/knee flexion- Left, decreased ankle dorsiflexion- Right, decreased ankle dorsiflexion- Left, ataxic, and wide BOS, Distance walked: 50 ft x 2, Assistive device utilized:Walker - 2 wheeled, Level of assistance: SBA and CGA, and Comments: Pt has increased weightshift and LOB towards R side  FUNCTIONAL TESTS:  5 times sit to stand: 37.72 sec 10 meter walk test: 27.69 sec with RW and supervision (1.18 ft/sec)  GOALS: Goals reviewed with patient? Yes  SHORT TERM GOALS: Target date: 08/29/2023  Pt will be independent with HEP for improved strength, flexibility. Baseline:  No current HEP; initiated 09/02/2023 Goal status: IN PROGRESS  2.  Pt will improve 5x sit<>stand to less than or equal to 30sec to demonstrate improved functional strength and transfer efficiency. Baseline: 37.72 sec Goal status: MET-28.75 seconds  LONG TERM GOALS: Target date: 09/26/2023  Pt will be independent with HEP for improved strength, balance, gait, flexibility, pain. Baseline:  No current HEP Goal status:IN PROGRESS  2.  Pt will improve 5x sit<>stand to less than or equal to 20sec to demonstrate improved functional strength and transfer efficiency. Baseline: 37.72 sec Goal status: IN PROGRESS  3.   Pt will demo improved knee flexion in painfree range by at least 10 degrees. Baseline: 115 degrees in sitting, with pain Goal status: IN PROGRESS  4.  Pt will improve gait velocity to at least 1.8 ft/sec for improved gait efficiency and safety. Baseline: 1.18 ft/sec Goal status: IN PROGRESS  5.  Pt will verbalize understanding of fall prevention in home environment. Baseline: was falling 3-4 times/day prior to hospitalization Goal status: IN PROGRESS   ASSESSMENT:  CLINICAL IMPRESSION: Pt presents today with reports on one fall since last session, quickly getting out of bed and feet sliding out from under him.  Skilled PT session focused on glut/hip abductor strengthening, and pt is best able to perform clamshell sidelying hip abduction; added this to HEP.  Worked on control with sit to stand and with squats.  Pt appears to have good awareness with slowed mobility today.  Pt will continue to benefit from skilled PT towards goals for improved functional mobility and decreased fall risk.  He has no complaints during or at the end of session.  OBJECTIVE IMPAIRMENTS: Abnormal gait, decreased balance, decreased knowledge of use of DME, decreased mobility, difficulty walking, decreased ROM, decreased strength, impaired flexibility, postural dysfunction, and pain.   PLAN:  PT FREQUENCY: 2x/week  PT DURATION: 6 weeks plus eval  PLANNED INTERVENTIONS: 97750- Physical Performance Testing, 97110-Therapeutic exercises, 97530- Therapeutic activity, [id number]- Neuromuscular re-education, 97535- Self Care, 16109- Manual therapy, [phone]- Gait training, Patient/Family education, Balance training, DME instructions, and Cryotherapy  PLAN FOR NEXT SESSION: monitor BP;  Review HEP  update and progress for strength, balance/weigthshifting; gait training with RW (3# weights on walker), fall prevention, safety education.   Kelsey Patricia., PT 09/09/23 1:16 PM [facility] [address] [place], [address] [place], [place] [address] Phone # [phone] Fax # [phone]

## 2023-09-11 ENCOUNTER — Ambulatory Visit: Admitting: Occupational Therapy

## 2023-09-11 ENCOUNTER — Ambulatory Visit

## 2023-09-11 DIAGNOSIS — M6281 Muscle weakness (generalized): Secondary | ICD-10-CM

## 2023-09-11 DIAGNOSIS — R293 Abnormal posture: Secondary | ICD-10-CM

## 2023-09-11 DIAGNOSIS — R2681 Unsteadiness on feet: Secondary | ICD-10-CM

## 2023-09-11 DIAGNOSIS — R270 Ataxia, unspecified: Secondary | ICD-10-CM

## 2023-09-11 DIAGNOSIS — R278 Other lack of coordination: Secondary | ICD-10-CM

## 2023-09-11 DIAGNOSIS — R2689 Other abnormalities of gait and mobility: Secondary | ICD-10-CM

## 2023-09-11 NOTE — Therapy (Signed)
 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   Past Surgical History:  Procedure Laterality Date   LAPAROSCOPIC APPENDECTOMY N/A 12/06/2021   Procedure: APPENDECTOMY LAPAROSCOPIC;  Surgeon: Oralee Billow, MD;  Location: [facility];  Service: General;  Laterality: N/A;   THYROID SURGERY     WISDOM TOOTH EXTRACTION     x4   Patient Active Problem List   Diagnosis Date Noted   Frequent falls  R Gluteal Tears 07/24/2023   Abnormality of femur 07/24/2023   Anemia 07/24/2023   Chronic health problem 07/24/2023   Appendicitis 12/05/2021   Gait abnormality 01/19/2019   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   Pt accompanied by: self (pt drove self)  PERTINENT HISTORY: PMH positive for spinal cerebellar ataxia, ADHD, HTN, anxiety/MDD, insomina.38 y/o male admitted to [facility] 07/23/23 with R LE pain after recent increase in falls. MRI positive for R gluteal muscle tears with plan for WBAT potential operative repair, d/c with recommendation to f/u  with orthopedics in 1 week.  PRECAUTIONS: Fall  WEIGHT BEARING RESTRICTIONS: No, WBAT  PAIN:  Are you having pain? Yes: NPRS scale: 3-4/10 Pain location: R glute, hamstring, knee Pain description: sharp Aggravating factors: extending R leg and prolonged standing Relieving factors: sitting, bending knee  FALLS: Has patient fallen in last 6 months? Yes. Number of falls reports falling multiple times per day  08/27/23 - Pt reported fall on 08/26/23 d/t falling backwards while up and walking. Pt had scrapes on L leg and reported only injury from fall. Pt reported able to get up by himself. Pt with fall again on 09/10/23.  LIVING ENVIRONMENT: Lives with: lives with their family (sister and niece) Lives in: House/apartment Stairs: 1 step into home  Has following equipment at home: Otho Blitz - 2 wheeled, Environmental consultant - 4 wheeled, shower chair, Grab bars, and non skid floor in shower  PLOF: Independent and Independent with basic ADLs  PATIENT GOALS: [facility] skills to open food containers and separate coffee filters, button pants  OBJECTIVE:  Note: Objective measures were completed at Evaluation unless otherwise noted.  HAND DOMINANCE: Right  ADLs: Transfers/ambulation related to ADLs: Mod I with RW, reports frequent falls Eating: difficulty with cutting and scooping foods,  Grooming: decreased motor control when brushing teeth and shaving UB Dressing: Mod I, in sitting LB Dressing: difficulty with buttoning pants, belt, tying shoes (tends to just slip shoes on/off) Toileting: Mod I - has a grab bar next to toilet Bathing:  Mod I - using shower chair and grab bars Tub Shower transfers: Mod I - using grab bars Equipment: Shower seat with back, Grab bars, and Walk in shower  IADLs: Light housekeeping: challenging as he was using a cane before recent hospitalization Meal Prep: uses microwave and makes smaller meals Community mobility: driving Medication management: Mod I - utilizing pill  box Handwriting: 80% legibility; pt writing (whales live [place]) in 30.85 seconds with large sizing and no regard to lined paper, some letters way above and some below lines.  MOBILITY STATUS: Needs Assist: CGA to Min A with ambulation from treatment room to front of clinic due to 2 LOB and Hx of falls  POSTURE COMMENTS:  rounded shoulders and forward head  ACTIVITY TOLERANCE: Activity tolerance: WFL for tasks assessed on eval  FUNCTIONAL OUTCOME MEASURES: PSFS  UPPER EXTREMITY ROM:  WFL bilaterally, pt with mild delayed speed and proprioception on LUE   HAND FUNCTION: Grip strength: Right: 100 lbs; Left: 95 lbs  COORDINATION: Finger Nose Finger test: dysmetria bilaterally L> R 9 Hole Peg test: Right: 81.06 sec; Left: 89.12 sec - pt dropping 1 peg on L, 2 on R Box and Blocks:  Right 23 blocks, Left 19 blocks RAM: symmetrical when completed slowly, disdiadochokinesia with RAM.  SENSATION: Numbness/tingling through LUE  COGNITION: Overall cognitive status: Within functional limits for tasks assessed  VISION: Subjective report: double vision, wears glasses with prisms to decrease double vision Baseline vision: Wears glasses all the time  VISION ASSESSMENT: To be further assessed in functional context  PRAXIS: Impaired: Motor planning  OBSERVATIONS: Pt required Min A with ambulation with RW due to multiple LOB.  Pt demonstrating significant difficulty with bilateral tasks and coordination, LUE> RUE.                                                                                                                             TREATMENT DATE:  09/11/23 BP 139/80 with HR of 102 Pt reporting fall yesterday, stating no new acute pain but continues to have some discomfort from pre-existing glute tear Strengthening: pt performing velcro resistance various sizes and shapes for key turning, rolling single hand, and turning for L/R hand all for 2 rounds each direction to improve  grip strength, forearm extensor/flexors. Pt performing seated BUE strengthening with 4# dumbbells for weight bearing, modified as needed for curls, chest press, and single UE reaching and placing of weighted item. FMC/grip strength: 50#resistance on gripper for 10 each hand prior to using gripper to remove 15 pegs each hand while using alternate hand as a stabilizer.  GMC: stacking cups game with 10/10 cones with knocking over 1x, to improve gross motor skills and reaching away from the body.   09/09/23 Box and blocks: L: 23 blocks and R: 30 blocks FMC: engaged in small peg board pattern replication with use of RUE and LUE with focus on picking up small pegs and placing into pegboard.  Pt demonstrating improved ability to pick up from mixed pegs vs from table top with RUE, however benefiting from placement of peg onto grid with LUE and then readjusting pinch grip.  Pt demonstrating increased difficulty with LUE > RUE.  OT then challenging pt to remove 5-8 pegs with RUE and then LUE and translate finger tips to hold in palm.  Pt with max difficulty with translation, utilizing opposite hand or chest to stabilize and transfer to palm of hand with RUE, demonstrating improved ease with translation with LUE. GMC: placing golf balls on top of cones with use of tongs with RUE and LUE.  Pt demonstrating some increased shoulder abduction bilaterally in attempts to compensate for decreased motor control.  OT educating on attempts at reaching with keeping arms closer to side to improve motor control.  Pt with increased difficulty with LUE > RUE.    09/04/23 BP 126/79 at beginning of session GMC:  engaged in picking up large jacks and placing into same color cones with RUE and LUE with focus on gross and fine motor control.  Pt completing with RUE, with increased ataxia when reaching across midline and when pouring out cones.  Pt with increased difficulty when completing with LUE, however when cued to visually attend  to task pt with min increase in control.  Pt knocking over stacks of cones initially due to ataxia and decreased attention to task,  Engaged in moving rings from one cone to the other with RUE and LUE with challenge to not touch the sides of the cones.  Pt bumping into sides ~35% of time with RUE and ~50% with LUE, however not knocking over stacks of cones. FMC: engaged in picking up coins one at a time with RUE and LUE and placing into coin slot.  Pt with increased difficulty picking up nickels from table top.  OT providing education on placing a wash cloth or towel under small items to allow for friction as coins initially sliding on table top.  OT also recommending attempting to pick up smaller coins before larger to see if fatigue playing a part in increased challenge with picking up smaller coins.  Transitioned to picking up variety of small items from wash cloth to challenge motor control, while still allowing for ease with picking up.    09/02/23 BP 131/96 at beginning of session. Tying shoes: engaged in massed practice with tying shoes with tissue box shoe example.  Pt demonstrating difficulty with sustaining grasp and manipulation of laces with L hand.  OT educating on 2 bunny ear option as well as securing ends of laces and completing square knot option to increase ease with tying shoes.  Attempted square knot option on personal shoe, however due to larger holes at laces unable to secure laces.  OT providing cues to visually attend to task to increase motor control and to continue to attempt alternative strategies to see if one may work better for pt. Buttons: engaged in buttoning/unbuttoning 3 buttons with difficulty with buttoning due to decreased coordination L >R UE.  OT educating pt on use of button hook to fasten buttons.  Pt demonstrating significant improvements with use of button hook.  OT providing recommendation for purchase options if desired.   Coordination: OT reiterating engagement  in HEP provided during previous session with pt stating that he had lost the paperwork.  OT providing additional handout and encouraging completing a few at a time 2-3x/day for continued focus on motor control.   PATIENT EDUCATION:  Education details: see today's treatment above Person educated: Patient Education method: Explanation and Handouts Education comprehension: verbalized understanding and needs further education  HOME EXERCISE PROGRAM: 08/27/23 - FM coordination HEP (see pt instructions, handout provided)   GOALS: Goals reviewed with patient? Yes  SHORT TERM GOALS: Target date: 09/05/23  Pt will be independent with [facility] and GMC HEP with use of visual handouts. Baseline: new to OPOT Goal status: in progress  2.  Pt will verbalize understanding of recommendations for adaptive strategies, AE/DME, and/or modifications to home setup to increase independence and safety with ability to complete ADLs.  Baseline: pt with frequent falls Goal status: in progress  3.  Pt will recall at least 3 sensory safety precautions and compensations to improve safety during functional tasks.  Baseline: impaired sensation LUE> RUE Goal status: INITIAL   LONG TERM GOALS: Target date: 09/26/23  Pt will demonstrate improved gross motor coordination to increase independence with ADLs and IADLs as evidenced by improved score on Box and Blocks assessment by 8 blocks bilaterally.  Baseline: Right 23 blocks, Left 19 blocks 09/09/23: Left: 23 blocks, Right: 30 blocks Goal status: in progress  2.  Pt will demonstrate improved fine motor coordination for ADLs as evidenced by decreasing 9 hole peg test score by 10 secs bilaterally. Baseline: Right: 81.06 sec; Left: 89.12 sec - pt dropping 1 peg on L, 2 on R Goal status: in progress  3.  Pt will report and/or demonstrate improved coordination to manage clothing fasteners (pants button) by decreasing score on 3 button/unbutton assessment to 45 seconds or less.   Baseline: 54.56 sec Goal status: in progress  4.  Pt will write a 10-15 item grocery list with 100% legibility and good adherence to lines and spacing. Baseline: 80% legibility; pt writing (whales live in a blue ocean) in 30.85 seconds with large sizing and no regard to lined paper, some letters way above and some below lines. Goal status: INITIAL  5.  Patient will report at least two-point increase in average PSFS score or at least three-point increase in a single activity score indicating functionally significant improvement given minimum detectable change. Baseline: 1.3 Goal status: INITIAL   ASSESSMENT:  CLINICAL IMPRESSION: Pt tolerated fine and gross motor tasks fairly well this date, focusing on grip strengthening and in hand manipulation as well as gripping small peg items. Note pt declined working on Museum/gallery conservator. Pt frustrated by diagnosis and decline over the years, provided encouragement to keep working and improve skills, continue to do activities of interest like video games for enjoyment. Recommended doing this at home for continued GMC/FMC. Pt tolerated session well addressing grip strength, GMC/FMC.   Pt will continue to benefit from skilled occupational therapy services to address coordination, pain management, altered sensation, balance, GM/FM control, cognition, safety awareness, introduction of compensatory strategies/AE prn, visual-perception, and implementation of an HEP to improve participation and safety during ADLs and IADLs.    PERFORMANCE DEFICITS: in functional skills including ADLs, IADLs, coordination, proprioception, sensation, pain, Fine motor control, Gross motor control, balance, body mechanics, endurance, decreased knowledge of precautions, decreased knowledge of use of DME, vision, and UE functional use and psychosocial skills including coping strategies, environmental adaptation, and routines and behaviors.    PLAN:  OT FREQUENCY: 1-2x/week  OT  DURATION: 6 weeks  PLANNED INTERVENTIONS: 97168 OT Re-evaluation, 97535 self care/ADL training, 84132 therapeutic exercise, 97530 therapeutic activity, 97112 neuromuscular re-education, functional mobility training, visual/perceptual remediation/compensation, psychosocial skills training, energy conservation, coping strategies training, patient/family education,  and DME and/or AE instructions  RECOMMENDED OTHER SERVICES: may benefit from SLP evaluation due to report of swallowing issues  CONSULTED AND AGREED WITH PLAN OF CARE: Patient  PLAN FOR NEXT SESSION:  Monitor BP  Review FM coordination HEP PRN  initiate  GMC HEP  WB activities for motor control  education on AE/DME   Doroteo Gasmen, OTR/L 09/11/2023, 11:59 AM   [facility] at [facility] [address] [place], [place] [address] Phone # [phone] Fax # [phone]

## 2023-09-11 NOTE — Therapy (Signed)
 OUTPATIENT PHYSICAL THERAPY NEURO TREATMENT   Patient Name: Jonathan Hopkins MRN: [id number] DOB:Jun 06, 1985, 38 y.o., male Today's Date: 09/11/2023   PCP: Fonda Hymen, NP REFERRING PROVIDER: Charmel Cooter, MD   END OF SESSION:  PT End of Session - 09/11/23 1030     Visit Number 6    Number of Visits 13    Date for PT Re-Evaluation 09/26/23    Authorization Type Medicare Part A&B / [place] Medicaid 2025  Auth: NOT REQUIRED due to Medicare primary  VL: 27 visits combined    Authorization - Visit Number 6    Progress Note Due on Visit 27    PT Start Time 1100    PT Stop Time 1145    PT Time Calculation (min) 45 min    Activity Tolerance Patient tolerated treatment well    Behavior During Therapy WFL for tasks assessed/performed           Past Medical History:  Diagnosis Date   ADHD    Gait abnormality 01/19/2019   Hypertension    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   Past Surgical History:  Procedure Laterality Date   LAPAROSCOPIC APPENDECTOMY N/A 12/06/2021   Procedure: APPENDECTOMY LAPAROSCOPIC;  Surgeon: Oralee Billow, MD;  Location: [facility];  Service: General;  Laterality: N/A;   THYROID SURGERY     WISDOM TOOTH EXTRACTION     x4   Patient Active Problem List   Diagnosis Date Noted   Frequent falls  R Gluteal Tears 07/24/2023   Abnormality of femur 07/24/2023   Anemia 07/24/2023   Chronic health problem 07/24/2023   Appendicitis 12/05/2021   Gait abnormality 01/19/2019   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 Feeling sore in buttocks.  Pt accompanied by: self  PERTINENT HISTORY: PMH positive for spinal cerebellar ataxia, ADHD, HTN, anxiety/MDD, insomina.38 y/o male admitted to [facility] 07/23/23 with R LE pain after recent increase in falls. MRI positive for R gluteal muscle tears with plan for WBAT potential operative repair, d/c with recommendation to f/u with orthopedics  in 1 week. (Pt reports he has not had follow-up)  PAIN:  Are you having pain? Yes: NPRS scale: 3/10 buttocks, knee; 1/10 R Elbow Pain location: R elbow Pain description: sore, swollen Aggravating factors: fall Relieving factors: just giving it time  PRECAUTIONS: Fall and Other: Pt states no restrictions that he knows of; pt unaware of any follow-up  WEIGHT BEARING RESTRICTIONS: No  FALLS: Has patient fallen in last 6 months? Yes. Number of falls 1 fall since hospitalization in April; prior to that, 3-4/day  LIVING ENVIRONMENT: Lives with: lives with their family Lives in: House/apartment Stairs: 1 step into home  Has following equipment at home: Single point cane, Environmental consultant - 2 wheeled, shower chair, and Grab bars  PLOF: Independent with household mobility with device  PATIENT GOALS: Pt's goals are to improve strength and coordination  OBJECTIVE:   TODAY'S TREATMENT: 09/11/23 Activity Comments  120/80 mmHg   Sled push x 5 min, 35#   Gait training -trials w/ U-step and laser light for visual cue/sequence. Added green resistance band to pull post. On gait belt  LE PRE 3x15 -LAQ, 5# -hip add iso -clamshells green loop -hamstring curls- double green            TODAY'S TREATMENT: 09/09/2023 Activity Comments  Vitals 120/82, HR 9=104   Sidelying hip abduction  2 x10 Sidelying clamshells, 2 x 10 No weight, then 3#, cues to prevent hip flexion compensation 3#  Bridging with HS  emphasis with toes raised 2 x 10 reps   RLE piriformis stretch, 2 x 10 sec No pain  Sit to stand, 2 x 5 reps From mat surface, UE support and cues for control  Minisquats x 10 Cues for technique  Short distance gait in clinic with 3# weights on RW Slowed pace, good attention to staying in walker BOS   HOME EXERCISE PROGRAM: Access Code: [email or web address] URL: [email or web address] Date: 09/09/2023 Prepared by: [facility] - Outpatient  Rehab - [facility]  Program Notes *Look for adjustable weight ankle weights (2-10#)Henkelion is an example of a good brand  Exercises - Seated Gluteal Sets  - 1-2 x daily - 7 x weekly - 2 sets - 10 reps - 3 sec hold - Sitting Knee Extension with Resistance  - 1 x daily - 4 x weekly - 3 sets - 10 reps - Seated Hamstring Curl with Anchored Resistance  - 1 x daily - 4 x weekly - 3 sets - 10 reps - Clamshell  - 1 x daily - 7 x weekly - 3 sets - 10 reps - 3 sec hold  PATIENT EDUCATION: Education details: Educated on signs/symptoms of further issues with elbow such as infection and when to contact MD; updates to HEP Person educated: Patient Education method: Programmer, multimedia, Demonstration, Tactile cues, Verbal cues, and Handouts Education comprehension: verbalized understanding, returned demonstration, and needs further education   ____________________________________________________________________________________________ Note: Objective measures were completed at Evaluation unless otherwise noted.  DIAGNOSTIC FINDINGS: No acute fractures  COGNITION: Overall cognitive status: Within functional limits for tasks assessed   SENSATION: Light touch: WFL  COORDINATION: Slowed alternating foot taps  EDEMA:  Mild swelling noted R ankle  PALPATION:  Along R IT band, along lateral aspect of R knee Ligament tests negative for pain  POSTURE: rounded shoulders, forward head, and flexed trunk   LOWER EXTREMITY ROM:    Active  Right Eval Left Eval   Hip flexion    Hip extension    Hip abduction    Hip  adduction    Hip internal rotation    Hip external rotation    Knee flexion 115 with pain (sitting)   Knee extension Supine 90-90:  -10 degrees   Ankle dorsiflexion    Ankle plantarflexion    Ankle inversion    Ankle eversion     (Blank rows = not tested)  LOWER EXTREMITY MMT:   MMT Right Eval Left Eval  Hip flexion 4+ 5  Hip extension 3+ 4  Hip abduction 3+   Hip adduction    Hip internal rotation    Hip external rotation    Knee flexion 4+ 5  Knee extension 5 5  Ankle dorsiflexion    Ankle plantarflexion    Ankle inversion    Ankle eversion    (Blank rows = not tested)  TRANSFERS: Sit to stand: Modified independence  Assistive device utilized: None     Stand to sit: Modified independence  Assistive device utilized: decreased eccentric control and None      GAIT: Findings: Gait Characteristics: step through pattern, decreased step length- Right, decreased step length- Left, decreased hip/knee flexion- Right, decreased hip/knee flexion- Left, decreased ankle dorsiflexion- Right, decreased ankle dorsiflexion- Left, ataxic, and wide BOS, Distance walked: 50 ft x 2, Assistive device utilized:Walker - 2 wheeled, Level of assistance: SBA and CGA, and Comments: Pt has increased weightshift and LOB towards R side  FUNCTIONAL TESTS:  5 times sit to stand: 37.72 sec 10 meter walk test: 27.69 sec with RW and supervision (1.18 ft/sec)  GOALS: Goals reviewed with patient? Yes  SHORT TERM GOALS: Target date: 08/29/2023  Pt will be independent with HEP for improved strength, flexibility. Baseline:  No current HEP; initiated 09/02/2023 Goal status: IN PROGRESS  2.  Pt will improve 5x sit<>stand to less than or equal to 30sec to demonstrate improved functional strength and transfer efficiency. Baseline: 37.72 sec Goal status: MET-28.75 seconds  LONG TERM GOALS: Target date: 09/26/2023  Pt will be independent with HEP for  improved strength, balance, gait, flexibility, pain. Baseline:  No current HEP Goal status:IN PROGRESS  2.  Pt will improve 5x sit<>stand to less than or equal to 20sec to demonstrate improved functional strength and transfer efficiency. Baseline: 37.72 sec Goal status: IN PROGRESS  3.  Pt will demo improved knee flexion in painfree range by at least 10 degrees. Baseline: 115 degrees in sitting, with pain Goal status: IN PROGRESS  4.  Pt will improve gait velocity to at least 1.8 ft/sec for improved gait efficiency and safety. Baseline: 1.18 ft/sec Goal status: IN PROGRESS  5.  Pt will verbalize understanding of fall prevention in home environment. Baseline: was falling 3-4 times/day prior to hospitalization Goal status: IN PROGRESS   ASSESSMENT:  CLINICAL IMPRESSION: Gait training techniques to improve stability and to normalize kinematics/mechanics with improved control and more narrow BOS with use of U-step and laser light on ground for visual cue and addition of midline resistance to provide kinesthetic awareness to also reinforce narrow BOS and safety during turns w/ SBA-CGA for managing turns. Strength training activities to improve endurance and functional activity tolerance. Demo a more narrow BOS w/ RW at end of session maintaining feet within walker. Continued sessions to advance POC details to improve mobility and reduce risk for falls  OBJECTIVE IMPAIRMENTS: Abnormal gait, decreased balance, decreased knowledge of use of DME, decreased mobility, difficulty walking, decreased ROM, decreased strength, impaired flexibility, postural dysfunction, and pain.   PLAN:  PT FREQUENCY: 2x/week  PT DURATION: 6  weeks plus eval  PLANNED INTERVENTIONS: 97750- Physical Performance Testing, 97110-Therapeutic exercises, 97530- Therapeutic activity, 97112- Neuromuscular re-education, 97535- Self Care, 45409- Manual therapy, [phone]- Gait training, Patient/Family education, Balance training,  DME instructions, and Cryotherapy  PLAN FOR NEXT SESSION: monitor BP;  Review HEP update and progress for strength, balance/weigthshifting; gait training with RW (3# weights on walker), fall prevention, safety education.   Angie Bari, PT 09/11/23 10:31 AM [facility] [address] [place], [address] [place], [place] [address] Phone # [phone] Fax # [phone]

## 2023-09-16 ENCOUNTER — Encounter: Payer: Self-pay | Admitting: Physical Therapy

## 2023-09-16 ENCOUNTER — Ambulatory Visit: Admitting: Physical Therapy

## 2023-09-16 ENCOUNTER — Ambulatory Visit: Admitting: Occupational Therapy

## 2023-09-16 DIAGNOSIS — R2689 Other abnormalities of gait and mobility: Secondary | ICD-10-CM

## 2023-09-16 DIAGNOSIS — R2681 Unsteadiness on feet: Secondary | ICD-10-CM

## 2023-09-16 DIAGNOSIS — R270 Ataxia, unspecified: Secondary | ICD-10-CM | POA: Diagnosis not present

## 2023-09-16 DIAGNOSIS — M6281 Muscle weakness (generalized): Secondary | ICD-10-CM

## 2023-09-16 NOTE — Patient Instructions (Signed)
 We discussed the option of looking into a wheelchair for home to help to decrease falls.  You would need to request an order for Wheelchair assessment/wheelchair evaluation  -This would be sent to our [facility] at [facility]  Once the order is received, they would call you to schedule a wheelchair evaluation

## 2023-09-16 NOTE — Therapy (Signed)
 OUTPATIENT PHYSICAL THERAPY NEURO TREATMENT   Patient Name: Jonathan Hopkins MRN: [id number] DOB:[birth year], 38 y.o., male Today's Date: 09/16/2023   PCP: Fonda Hymen, NP REFERRING PROVIDER: Charmel Cooter, MD   END OF SESSION:  PT End of Session - 09/16/23 1320     Visit Number 7    Number of Visits 13    Date for PT Re-Evaluation 09/26/23    Authorization Type Medicare Part A&B / [place] Medicaid 2025  Auth: NOT REQUIRED due to Medicare primary  VL: 27 visits combined    Authorization - Visit Number 7    Progress Note Due on Visit 27    PT Start Time 1320    PT Stop Time 1400    PT Time Calculation (min) 40 min    Equipment Utilized During Treatment Gait belt    Activity Tolerance Patient tolerated treatment well    Behavior During Therapy WFL for tasks assessed/performed            Past Medical History:  Diagnosis Date   ADHD    Gait abnormality 01/19/2019   Hypertension    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   Past Surgical History:  Procedure Laterality Date   LAPAROSCOPIC APPENDECTOMY N/A 12/06/2021   Procedure: APPENDECTOMY LAPAROSCOPIC;  Surgeon: Oralee Billow, MD;  Location: [facility];  Service: General;  Laterality: N/A;   THYROID SURGERY     WISDOM TOOTH EXTRACTION     x4   Patient Active Problem List   Diagnosis Date Noted   Frequent falls  R Gluteal Tears 07/24/2023   Abnormality of femur 07/24/2023   Anemia 07/24/2023   Chronic health problem 07/24/2023   Appendicitis 12/05/2021   Gait abnormality 01/19/2019   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 Pt accompanied by: self  PERTINENT HISTORY: PMH positive for spinal cerebellar ataxia, ADHD, HTN, anxiety/MDD, insomina.38 y/o male admitted to [facility] 07/23/23 with R LE pain after recent increase in falls. MRI positive for R gluteal muscle tears with plan for WBAT potential operative repair, d/c  with recommendation to f/u with orthopedics in 1 week. (Pt reports he has not had follow-up)  PAIN:  Are you having pain? Yes: NPRS scale: 3/10 buttocks, knee; 1/10 R Elbow Pain location: R elbow Pain description: sore, swollen Aggravating factors: fall Relieving factors: just giving it time  PRECAUTIONS: Fall and Other: Pt states no restrictions that he knows of; pt unaware of any follow-up  WEIGHT BEARING RESTRICTIONS: No  FALLS: Has patient fallen in last 6 months? Yes. Number of falls 1 fall since hospitalization in April; prior to that, 3-4/day  LIVING ENVIRONMENT: Lives with: lives with their family Lives in: House/apartment Stairs: 1 step into home  Has following equipment at home: Single point cane, Environmental consultant - 2 wheeled, shower chair, and Grab bars  PLOF: Independent with household mobility with device  PATIENT GOALS: Pt's goals are to improve strength and coordination  OBJECTIVE:    TODAY'S TREATMENT: 09/16/2023 Activity Comments  126/94, HR 107 Pre-session  Gait with U-step walker using laser light Supervision, good narrow BOS and slowed pace  Gait with RW with red theraband for cues for more narrowed foot placement Uses visual cues of theraband for narrowed BOS  Gait with rollator with red theraband placed over seate Uses visual cues of theraband for narrowed BOS  127/89, HR 103 End of session             HOME EXERCISE PROGRAM: Access Code: [email or web address] URL:  [email or web address] Date: 09/09/2023 Prepared by: [facility] - Outpatient  Rehab - [facility]  Program Notes *Look for adjustable weight ankle weights (2-10#)Henkelion is an example of a good brand  Exercises - Seated Gluteal Sets  - 1-2 x daily - 7 x weekly - 2 sets - 10 reps - 3 sec hold - Sitting Knee Extension with Resistance  - 1 x daily - 4 x weekly - 3 sets - 10 reps - Seated Hamstring Curl with Anchored Resistance  - 1 x daily - 4 x weekly - 3 sets - 10 reps - Clamshell  - 1 x daily - 7 x weekly - 3 sets - 10 reps - 3 sec hold  PATIENT EDUCATION: Education details: Discussed options for safety with rollator/RW at home-including use of theraband for visual cue to help narrow BOS with gait; discussed typical procedure for proceeding with wheelchair evaluation (request order from MD, then eval at [facility], follow up with face to face with MD); benefits of power mobility at home for optimal safety, decreased fall risk Person educated: Patient Education method: Explanation, Demonstration, Tactile cues, Verbal cues, and Handouts Education comprehension: verbalized understanding, returned demonstration, and needs further education   ____________________________________________________________________________________________ Note: Objective measures were completed at Evaluation unless otherwise noted.  DIAGNOSTIC FINDINGS: No acute fractures  COGNITION: Overall cognitive status: Within functional limits for tasks assessed   SENSATION: Light touch: WFL  COORDINATION: Slowed alternating foot taps  EDEMA:  Mild swelling noted R ankle  PALPATION:  Along R IT band, along lateral aspect of R knee Ligament tests negative for pain  POSTURE: rounded shoulders, forward head, and flexed trunk   LOWER EXTREMITY ROM:    Active  Right Eval Left Eval  Hip flexion    Hip extension    Hip abduction    Hip adduction    Hip internal  rotation    Hip external rotation    Knee flexion 115 with pain (sitting)   Knee extension Supine 90-90:  -10 degrees   Ankle dorsiflexion  Ankle plantarflexion    Ankle inversion    Ankle eversion     (Blank rows = not tested)  LOWER EXTREMITY MMT:   MMT Right Eval Left Eval  Hip flexion 4+ 5  Hip extension 3+ 4  Hip abduction 3+   Hip adduction    Hip internal rotation    Hip external rotation    Knee flexion 4+ 5  Knee extension 5 5  Ankle dorsiflexion    Ankle plantarflexion    Ankle inversion    Ankle eversion    (Blank rows = not tested)  TRANSFERS: Sit to stand: Modified independence  Assistive device utilized: None     Stand to sit: Modified independence  Assistive device utilized: decreased eccentric control and None      GAIT: Findings: Gait Characteristics: step through pattern, decreased step length- Right, decreased step length- Left, decreased hip/knee flexion- Right, decreased hip/knee flexion- Left, decreased ankle dorsiflexion- Right, decreased ankle dorsiflexion- Left, ataxic, and wide BOS, Distance walked: 50 ft x 2, Assistive device utilized:Walker - 2 wheeled, Level of assistance: SBA and CGA, and Comments: Pt has increased weightshift and LOB towards R side  FUNCTIONAL TESTS:  5 times sit to stand: 37.72 sec 10 meter walk test: 27.69 sec with RW and supervision (1.18 ft/sec)  GOALS: Goals reviewed with patient? Yes  SHORT TERM GOALS: Target date: 08/29/2023  Pt will be independent with HEP for improved strength, flexibility. Baseline:  No current HEP; initiated 09/02/2023 Goal status: IN PROGRESS  2.  Pt will improve 5x sit<>stand to less than or equal to 30sec to demonstrate improved functional strength and transfer efficiency. Baseline: 37.72 sec Goal status: MET-28.75 seconds  LONG TERM GOALS: Target date: 09/26/2023  Pt will be independent with HEP for improved strength, balance, gait, flexibility, pain. Baseline:  No current HEP Goal  status:IN PROGRESS  2.  Pt will improve 5x sit<>stand to less than or equal to 20sec to demonstrate improved functional strength and transfer efficiency. Baseline: 37.72 sec Goal status: IN PROGRESS  3.  Pt will demo improved knee flexion in painfree range by at least 10 degrees. Baseline: 115 degrees in sitting, with pain Goal status: IN PROGRESS  4.  Pt will improve gait velocity to at least 1.8 ft/sec for improved gait efficiency and safety. Baseline: 1.18 ft/sec Goal status: IN PROGRESS  5.  Pt will verbalize understanding of fall prevention in home environment. Baseline: was falling 3-4 times/day prior to hospitalization Goal status: IN PROGRESS   ASSESSMENT:  CLINICAL IMPRESSION: Pt presents today with no new complaints, no falls since last visit. Skilled PT session focused on education about mobility safety in the home-need to use walker at all times and discussed options of power mobility in the home as well as how to proceed with this process, due to progressive nature of his disease.  Pt reports narrowed BOS with gait using U-step Rollator and laser light last session.  Used this again; however, pt does not want to proceed to try to get U-step rollator due to concerns about cost.  Simulated laser light with red theraband tied at [facility] and also with rollator, with pt also feeling and demonstrating more control with more narrowed BOS.  Provided red theraband for his walker for home to continue to trial for improved gait pattern and safety.  Pt will continue to benefit from skilled PT towards goals for improved functional mobility and decreased fall risk.   OBJECTIVE IMPAIRMENTS: Abnormal gait, decreased balance, decreased knowledge of  use of DME, decreased mobility, difficulty walking, decreased ROM, decreased strength, impaired flexibility, postural dysfunction, and pain.   PLAN:  PT FREQUENCY: 2x/week  PT DURATION: 6 weeks plus eval  PLANNED INTERVENTIONS: 97750- Physical  Performance Testing, 97110-Therapeutic exercises, 97530- Therapeutic activity, [id number]- Neuromuscular re-education, 97535- Self Care, 81191- Manual therapy, [phone]- Gait training, Patient/Family education, Balance training, DME instructions, and Cryotherapy  PLAN FOR NEXT SESSION: monitor BP;  How did theraband go on his walker at home?  Review HEP update and progress for strength, balance/weigthshifting; gait training with RW (3# weights on walker), fall prevention, safety education.   Kelsey Patricia., PT 09/16/23 3:05 PM [facility] at [facility] [address] [place], [address] [place], [place] [address] Phone # [phone] Fax # [phone]

## 2023-09-18 ENCOUNTER — Ambulatory Visit: Admitting: Occupational Therapy

## 2023-09-18 ENCOUNTER — Ambulatory Visit

## 2023-09-18 DIAGNOSIS — M6281 Muscle weakness (generalized): Secondary | ICD-10-CM

## 2023-09-18 DIAGNOSIS — R270 Ataxia, unspecified: Secondary | ICD-10-CM

## 2023-09-18 DIAGNOSIS — R2681 Unsteadiness on feet: Secondary | ICD-10-CM

## 2023-09-18 DIAGNOSIS — R278 Other lack of coordination: Secondary | ICD-10-CM

## 2023-09-18 DIAGNOSIS — R2689 Other abnormalities of gait and mobility: Secondary | ICD-10-CM

## 2023-09-18 DIAGNOSIS — R293 Abnormal posture: Secondary | ICD-10-CM

## 2023-09-18 NOTE — Therapy (Signed)
 OUTPATIENT OCCUPATIONAL THERAPY NEURO Treatment  Patient Name: Jonathan Hopkins MRN: [id number] DOB:1985/04/18, 38 y.o., male Today's Date: 09/18/2023  PCP: Fonda Hymen, NP REFERRING PROVIDER: Charmel Cooter, MD  END OF SESSION:  OT End of Session - 09/18/23 1336     Visit Number 7    Number of Visits 11    Date for OT Re-Evaluation 09/26/23    Authorization Type Medicare A&B / [place] Medicaid    Authorization Time Period 27 visits combined    OT Start Time 1318    OT Stop Time 1358    OT Time Calculation (min) 40 min    Activity Tolerance Patient tolerated treatment well    Behavior During Therapy WFL for tasks assessed/performed               Past Medical History:  Diagnosis Date   ADHD    Gait abnormality 01/19/2019   Hypertension    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   Past Surgical History:  Procedure Laterality Date   LAPAROSCOPIC APPENDECTOMY N/A 12/06/2021   Procedure: APPENDECTOMY LAPAROSCOPIC;  Surgeon: Oralee Billow, MD;  Location: [facility];  Service: General;  Laterality: N/A;   THYROID SURGERY     WISDOM TOOTH EXTRACTION     x4   Patient Active Problem List   Diagnosis Date Noted   Frequent falls  R Gluteal Tears 07/24/2023   Abnormality of femur 07/24/2023   Anemia 07/24/2023   Chronic health problem 07/24/2023   Appendicitis 12/05/2021   Gait abnormality 01/19/2019   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  Pt reports that he is unsure the exact series of events, but that he fell hitting his forehead on the top of his chest of drawers.  Pt accompanied by: self (pt drove self)  PERTINENT HISTORY: PMH  positive for spinal cerebellar ataxia, ADHD, HTN, anxiety/MDD, insomina.38 y/o male admitted to [facility] 07/23/23 with R LE pain after recent increase in falls. MRI positive for R gluteal muscle tears with plan for WBAT potential operative repair, d/c with recommendation to f/u with orthopedics in 1 week.  PRECAUTIONS: Fall  WEIGHT BEARING RESTRICTIONS: No, WBAT  PAIN:  Are you having pain? No  FALLS: Has patient fallen in last 6 months? Yes. Number of falls reports falling multiple times per day  08/27/23 - Pt reported fall on 08/26/23 d/t falling backwards while up and walking. Pt had scrapes on L leg and reported only injury from fall. Pt reported able to get up by himself. Pt with fall again on 09/10/23.  LIVING ENVIRONMENT: Lives with: lives with their family (sister and niece) Lives in: House/apartment Stairs: 1 step into home  Has following equipment at home: Otho Blitz - 2 wheeled, Environmental consultant - 4 wheeled, shower chair, Grab bars, and non skid floor in shower  PLOF: Independent and Independent with basic ADLs  PATIENT GOALS: [facility] skills to open food containers and separate coffee filters, button pants  OBJECTIVE:  Note: Objective measures were completed at Evaluation unless otherwise noted.  HAND DOMINANCE: Right  ADLs: Transfers/ambulation related to ADLs: Mod I with RW, reports frequent falls Eating: difficulty with cutting and scooping foods,  Grooming: decreased motor control when brushing teeth and  shaving UB Dressing: Mod I, in sitting LB Dressing: difficulty with buttoning pants, belt, tying shoes (tends to just slip shoes on/off) Toileting: Mod I - has a grab bar next to toilet Bathing: Mod I - using shower chair and grab bars Tub Shower transfers: Mod I - using grab bars Equipment: Shower seat with back, Grab bars, and Walk in shower  IADLs: Light housekeeping: challenging as he was using a cane before recent hospitalization Meal Prep: uses microwave and makes smaller  meals Community mobility: driving Medication management: Mod I - utilizing pill box Handwriting: 80% legibility; pt writing (whales live in a blue ocean) in 30.85 seconds with large sizing and no regard to lined paper, some letters way above and some below lines.  MOBILITY STATUS: Needs Assist: CGA to Min A with ambulation from treatment room to front of clinic due to 2 LOB and Hx of falls  POSTURE COMMENTS:  rounded shoulders and forward head  ACTIVITY TOLERANCE: Activity tolerance: WFL for tasks assessed on eval  FUNCTIONAL OUTCOME MEASURES: PSFS  UPPER EXTREMITY ROM:  WFL bilaterally, pt with mild delayed speed and proprioception on LUE   HAND FUNCTION: Grip strength: Right: 100 lbs; Left: 95 lbs  COORDINATION: Finger Nose Finger test: dysmetria bilaterally L> R 9 Hole Peg test: Right: 81.06 sec; Left: 89.12 sec - pt dropping 1 peg on L, 2 on R Box and Blocks:  Right 23 blocks, Left 19 blocks RAM: symmetrical when completed slowly, disdiadochokinesia with RAM.  SENSATION: Numbness/tingling through LUE  COGNITION: Overall cognitive status: Within functional limits for tasks assessed  VISION: Subjective report: double vision, wears glasses with prisms to decrease double vision Baseline vision: Wears glasses all the time  VISION ASSESSMENT: To be further assessed in functional context  PRAXIS: Impaired: Motor planning  OBSERVATIONS: Pt required Min A with ambulation with RW due to multiple LOB.  Pt demonstrating significant difficulty with bilateral tasks and coordination, LUE> RUE.                                                                                                                             TREATMENT DATE:  09/18/23 Pegs: engaged in small peg board pattern activity with focus on coordination and motor control.  OT educating on keeping elbow in by side to increase stability and reduce ataxia - as pt frequently hiking shoulder and holding arm in  abduction/away from body.  Pt with mild increase in motor control from a reach standpoint with elbow in by side for abducted.  Tray of pegs sliding, therefore OT educated on Dycem to decrease sliding/slippage and pt benefiting from use to allow for focus on motor control while not worrying about tray moving against/opposite ataxia.   Reducing action tremors: OT providing pt with handout of strategies to reduce action tremor, reiterating recommendations to keep arms in by side, bracing against body or table as able, supporting at forearm or wrist, and use of weighted bowls/utensils.  OT  also educating on impact of stress on body movements and ability to focus on compensation strategies.   Buttons: massed practice with buttoning and unbuttoning with OT providing cues to keep arms in closer to side to increase motor control.  Pt still demonstrating shoulder hike and arms horizontally abducted, despite cues to keep arms in closer to side.  Pt completed 3 button/unbutton in 59.66 sec. Handwriting: pt writing 10 items on lined paper with slightly weighted pen with 100% legibility.  Pt still with larger print, but much better adherence to line placement and legibility.       09/11/23 BP 139/80 with HR of 102 Pt reporting fall yesterday, stating no new acute pain but continues to have some discomfort from pre-existing glute tear Strengthening: pt performing velcro resistance various sizes and shapes for key turning, rolling single hand, and turning for L/R hand all for 2 rounds each direction to improve grip strength, forearm extensor/flexors. Pt performing seated BUE strengthening with 4# dumbbells for weight bearing, modified as needed for curls, chest press, and single UE reaching and placing of weighted item. FMC/grip strength: 50#resistance on gripper for 10 each hand prior to using gripper to remove 15 pegs each hand while using alternate hand as a stabilizer.  GMC: stacking cups game with 10/10 cones with  knocking over 1x, to improve gross motor skills and reaching away from the body.     09/09/23 Box and blocks: L: 23 blocks and R: 30 blocks FMC: engaged in small peg board pattern replication with use of RUE and LUE with focus on picking up small pegs and placing into pegboard.  Pt demonstrating improved ability to pick up from mixed pegs vs from table top with RUE, however benefiting from placement of peg onto grid with LUE and then readjusting pinch grip.  Pt demonstrating increased difficulty with LUE > RUE.  OT then challenging pt to remove 5-8 pegs with RUE and then LUE and translate finger tips to hold in palm.  Pt with max difficulty with translation, utilizing opposite hand or chest to stabilize and transfer to palm of hand with RUE, demonstrating improved ease with translation with LUE. GMC: placing golf balls on top of cones with use of tongs with RUE and LUE.  Pt demonstrating some increased shoulder abduction bilaterally in attempts to compensate for decreased motor control.  OT educating on attempts at reaching with keeping arms closer to side to improve motor control.  Pt with increased difficulty with LUE > RUE.   PATIENT EDUCATION: Education details: see today's treatment above Person educated: Patient Education method: Explanation and Handouts Education comprehension: verbalized understanding and needs further education  HOME EXERCISE PROGRAM: 08/27/23 - FM coordination HEP (see pt instructions, handout provided)   GOALS: Goals reviewed with patient? Yes  SHORT TERM GOALS: Target date: 09/05/23  Pt will be independent with [facility] and GMC HEP with use of visual handouts. Baseline: new to OPOT Goal status: in progress  2.  Pt will verbalize understanding of recommendations for adaptive strategies, AE/DME, and/or modifications to home setup to increase independence and safety with ability to complete ADLs.  Baseline: pt with frequent falls Goal status: in progress  3.  Pt will  recall at least 3 sensory safety precautions and compensations to improve safety during functional tasks.  Baseline: impaired sensation LUE> RUE Goal status: INITIAL   LONG TERM GOALS: Target date: 09/26/23  Pt will demonstrate improved gross motor coordination to increase independence with ADLs and IADLs as evidenced by  improved score on Box and Blocks assessment by 8 blocks bilaterally.  Baseline: Right 23 blocks, Left 19 blocks 09/09/23: Left: 23 blocks, Right: 30 blocks Goal status: in progress  2.  Pt will demonstrate improved fine motor coordination for ADLs as evidenced by decreasing 9 hole peg test score by 10 secs bilaterally. Baseline: Right: 81.06 sec; Left: 89.12 sec - pt dropping 1 peg on L, 2 on R Goal status: in progress  3.  Pt will report and/or demonstrate improved coordination to manage clothing fasteners (pants button) by decreasing score on 3 button/unbutton assessment to 45 seconds or less.  Baseline: 54.56 sec 09/18/23: 59.68 sec Goal status: in progress  4.  Pt will write a 10-15 item grocery list with 100% legibility and good adherence to lines and spacing. Baseline: 80% legibility; pt writing (whales live in a blue ocean) in 30.85 seconds with large sizing and no regard to lined paper, some letters way above and some below lines. Goal status: INITIAL  5.  Patient will report at least two-point increase in average PSFS score or at least three-point increase in a single activity score indicating functionally significant improvement given minimum detectable change. Baseline: 1.3 Goal status: INITIAL   ASSESSMENT:  CLINICAL IMPRESSION: Pt tolerated fine and gross motor tasks fairly well this date, however continues to have difficulty with motor control - frequently horizontally abducting BUE instead of keeping arms in closer to body. Pt agreeable to working on writing this session, pt demonstrating much improved legibility and control when slowing down and taking  his time.  OT educated on compensatory strategies to increase motor control.   Recommended doing this at home for continued GMC/FMC.   Pt will continue to benefit from skilled occupational therapy services to address coordination, pain management, altered sensation, balance, GM/FM control, cognition, safety awareness, introduction of compensatory strategies/AE prn, visual-perception, and implementation of an HEP to improve participation and safety during ADLs and IADLs.    PERFORMANCE DEFICITS: in functional skills including ADLs, IADLs, coordination, proprioception, sensation, pain, Fine motor control, Gross motor control, balance, body mechanics, endurance, decreased knowledge of precautions, decreased knowledge of use of DME, vision, and UE functional use and psychosocial skills including coping strategies, environmental adaptation, and routines and behaviors.    PLAN:  OT FREQUENCY: 1-2x/week  OT DURATION: 6 weeks  PLANNED INTERVENTIONS: 97168 OT Re-evaluation, 97535 self care/ADL training, 14782 therapeutic exercise, 97530 therapeutic activity, 97112 neuromuscular re-education, functional mobility training, visual/perceptual remediation/compensation, psychosocial skills training, energy conservation, coping strategies training, patient/family education, and DME and/or AE instructions  RECOMMENDED OTHER SERVICES: may benefit from SLP evaluation due to report of swallowing issues  CONSULTED AND AGREED WITH PLAN OF CARE: Patient  PLAN FOR NEXT SESSION:  Monitor BP  Review FM coordination HEP PRN  initiate  GMC HEP  WB activities for motor control  education on AE/DME   Anthonette Kinsman, OTR/L 09/18/2023, 1:36 PM   [facility] at [facility] [address], [address] [place], [place] [address] Phone # [phone] Fax # [phone]

## 2023-09-18 NOTE — Therapy (Signed)
 OUTPATIENT PHYSICAL THERAPY NEURO TREATMENT   Patient Name: Jonathan Hopkins MRN: [id number] DOB:21-Dec-1985, 38 y.o., male Today's Date: 09/18/2023   PCP: Fonda Hymen, NP REFERRING PROVIDER: Charmel Cooter, MD   END OF SESSION:  PT End of Session - 09/18/23 1410     Visit Number 8    Number of Visits 13    Date for PT Re-Evaluation 09/26/23    Authorization Type Medicare Part A&B / [place] Medicaid 2025  Auth: NOT REQUIRED due to Medicare primary  VL: 27 visits combined    Authorization - Visit Number 8    Progress Note Due on Visit 27    PT Start Time 1400    PT Stop Time 1445    PT Time Calculation (min) 45 min    Equipment Utilized During Treatment Gait belt    Activity Tolerance Patient tolerated treatment well    Behavior During Therapy WFL for tasks assessed/performed            Past Medical History:  Diagnosis Date   ADHD    Gait abnormality 01/19/2019   Hypertension    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   Past Surgical History:  Procedure Laterality Date   LAPAROSCOPIC APPENDECTOMY N/A 12/06/2021   Procedure: APPENDECTOMY LAPAROSCOPIC;  Surgeon: Oralee Billow, MD;  Location: [facility];  Service: General;  Laterality: N/A;   THYROID SURGERY     WISDOM TOOTH EXTRACTION     x4   Patient Active Problem List   Diagnosis Date Noted   Frequent falls  R Gluteal Tears 07/24/2023   Abnormality of femur 07/24/2023   Anemia 07/24/2023   Chronic health problem 07/24/2023   Appendicitis 12/05/2021   Gait abnormality 01/19/2019   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    ONSET DATE: 07/25/2023 MD referral REFERRING DIAG: M79.89 (ICD-10-CM) - Right leg swelling  THERAPY DIAG:  Muscle weakness (generalized)  Unsteadiness on feet  Other abnormalities of gait and mobility  Ataxia  Abnormal posture  Rationale for Evaluation and Treatment: Rehabilitation  SUBJECTIVE:                                                                                                                                                                                              SUBJECTIVE STATEMENT: The t-band on the walker helps to cue  Pt accompanied by: self  PERTINENT HISTORY: PMH positive for spinal cerebellar ataxia, ADHD, HTN, anxiety/MDD, insomina.38 y/o male admitted to [facility] 07/23/23 with R LE pain after recent increase in falls. MRI positive for R gluteal muscle tears with plan for WBAT potential operative repair,  d/c with recommendation to f/u with orthopedics in 1 week. (Pt reports he has not had follow-up)  PAIN:  Are you having pain? Yes: NPRS scale: 3/10 buttocks, knee; 1/10 R Elbow Pain location: R elbow Pain description: sore, swollen Aggravating factors: fall Relieving factors: just giving it time  PRECAUTIONS: Fall and Other: Pt states no restrictions that he knows of; pt unaware of any follow-up  WEIGHT BEARING RESTRICTIONS: No  FALLS: Has patient fallen in last 6 months? Yes. Number of falls 1 fall since hospitalization in April; prior to that, 3-4/day  LIVING ENVIRONMENT: Lives with: lives with their family Lives in: House/apartment Stairs: 1 step into home  Has following equipment at home: Single point cane, Environmental consultant - 2 wheeled, shower chair, and Grab bars  PLOF: Independent with household mobility with device  PATIENT GOALS: Pt's goals are to improve strength and coordination  OBJECTIVE:   TODAY'S TREATMENT: 09/18/23 Activity Comments  NU-step resistance intervals x 8 min For rapid coordination/LE force output  Gait training -U-step: green t-band resistance by therapist for narrow BOS, safety for turns -step-ups w/ knee drive 1O10: 8 box, green band resist -lateral step ups 2x10, 8  LE PRE, 10# -standing march 2x10 -hip abduction 2x10 -standing hamstring curls 2x10 -standing hip ext 2x10               TODAY'S TREATMENT: 09/16/2023 Activity Comments  126/94, HR 107 Pre-session  Gait with  U-step walker using laser light Supervision, good narrow BOS and slowed pace  Gait with RW with red theraband for cues for more narrowed foot placement Uses visual cues of theraband for narrowed BOS  Gait with rollator with red theraband placed over seate Uses visual cues of theraband for narrowed BOS  127/89, HR 103 End of session             HOME EXERCISE PROGRAM: Access Code: [email or web address] URL: [email or web address] Date: 09/09/2023 Prepared by: [facility] - Outpatient  Rehab - [facility]  Program Notes *Look for adjustable weight ankle weights (2-10#)Henkelion is an example of a good brand  Exercises - Seated Gluteal Sets  - 1-2 x daily - 7 x weekly - 2 sets - 10 reps - 3 sec hold - Sitting Knee Extension with Resistance  - 1 x daily - 4 x weekly - 3 sets - 10 reps - Seated Hamstring Curl with Anchored Resistance  - 1 x daily - 4 x weekly - 3 sets - 10 reps - Clamshell  - 1 x daily - 7 x weekly - 3 sets - 10 reps - 3 sec hold  PATIENT EDUCATION: Education details: Discussed options for safety with rollator/RW at home-including use of theraband for visual cue to help narrow BOS with gait; discussed typical procedure for proceeding with wheelchair evaluation (request order from MD, then eval at [facility], follow up with face to face with MD); benefits of power mobility at home for optimal safety, decreased fall risk Person educated: Patient Education method: Explanation, Demonstration, Tactile cues, Verbal cues, and Handouts Education comprehension: verbalized understanding, returned demonstration, and needs further education   ____________________________________________________________________________________________ Note: Objective measures were completed at Evaluation unless otherwise noted.  DIAGNOSTIC FINDINGS: No acute fractures  COGNITION: Overall cognitive status: Within functional limits for tasks  assessed   SENSATION: Light touch: WFL  COORDINATION: Slowed alternating foot taps  EDEMA:  Mild swelling noted R ankle  PALPATION:  Along R IT band, along lateral aspect of R knee Ligament tests negative for  pain  POSTURE: rounded shoulders, forward head, and flexed trunk   LOWER EXTREMITY ROM:    Active  Right Eval Left Eval  Hip flexion    Hip extension    Hip abduction    Hip adduction    Hip internal rotation    Hip external rotation    Knee flexion 115 with pain (sitting)   Knee extension Supine 90-90:  -10 degrees   Ankle dorsiflexion    Ankle plantarflexion    Ankle inversion    Ankle eversion     (Blank rows = not tested)  LOWER EXTREMITY MMT:   MMT Right Eval Left Eval  Hip flexion 4+ 5  Hip extension 3+ 4  Hip abduction 3+   Hip adduction    Hip internal rotation    Hip external rotation    Knee flexion 4+ 5  Knee extension 5 5  Ankle dorsiflexion    Ankle plantarflexion    Ankle inversion    Ankle eversion    (Blank rows = not tested)  TRANSFERS: Sit to stand: Modified independence  Assistive device utilized: None     Stand to sit: Modified independence  Assistive device utilized: decreased eccentric control and None      GAIT: Findings: Gait Characteristics: step through pattern, decreased step length- Right, decreased step length- Left, decreased hip/knee flexion- Right, decreased hip/knee flexion- Left, decreased ankle dorsiflexion- Right, decreased ankle dorsiflexion- Left, ataxic, and wide BOS, Distance walked: 50 ft x 2, Assistive device utilized:Walker - 2 wheeled, Level of assistance: SBA and CGA, and Comments: Pt has increased weightshift and LOB towards R side  FUNCTIONAL TESTS:  5 times sit to stand: 37.72 sec 10 meter walk test: 27.69 sec with RW and supervision (1.18 ft/sec)  GOALS: Goals reviewed with patient? Yes  SHORT TERM GOALS: Target date: 08/29/2023  Pt will be independent with HEP for improved strength,  flexibility. Baseline:  No current HEP; initiated 09/02/2023 Goal status: IN PROGRESS  2.  Pt will improve 5x sit<>stand to less than or equal to 30sec to demonstrate improved functional strength and transfer efficiency. Baseline: 37.72 sec Goal status: MET-28.75 seconds  LONG TERM GOALS: Target date: 09/26/2023  Pt will be independent with HEP for improved strength, balance, gait, flexibility, pain. Baseline:  No current HEP Goal status:IN PROGRESS  2.  Pt will improve 5x sit<>stand to less than or equal to 20sec to demonstrate improved functional strength and transfer efficiency. Baseline: 37.72 sec Goal status: IN PROGRESS  3.  Pt will demo improved knee flexion in painfree range by at least 10 degrees. Baseline: 115 degrees in sitting, with pain Goal status: IN PROGRESS  4.  Pt will improve gait velocity to at least 1.8 ft/sec for improved gait efficiency and safety. Baseline: 1.18 ft/sec Goal status: IN PROGRESS  5.  Pt will verbalize understanding of fall prevention in home environment. Baseline: was falling 3-4 times/day prior to hospitalization Goal status: IN PROGRESS   ASSESSMENT:  CLINICAL IMPRESSION: NU-step for pre-gait to promote rapid alternating movement and varying resistance to improve LE force. Gait training w/ U-step for visual cue for sequence and assisting narrow BOS and therapist pulling for resistance to promote trunk resistance for kinesthetic awareness. Stair/step training to improve LE strength and safety with ascending increased step height/curbs. LE PRE to improve strength and activity tolerance tolerating heavy resistance quite well.  Doing well w/ U-step to control BOS and improved safety in turns by ensuring more narrow BOS   OBJECTIVE IMPAIRMENTS:  Abnormal gait, decreased balance, decreased knowledge of use of DME, decreased mobility, difficulty walking, decreased ROM, decreased strength, impaired flexibility, postural dysfunction, and pain.    PLAN:  PT FREQUENCY: 2x/week  PT DURATION: 6 weeks plus eval  PLANNED INTERVENTIONS: 97750- Physical Performance Testing, 97110-Therapeutic exercises, 97530- Therapeutic activity, [id number]- Neuromuscular re-education, 97535- Self Care, 16109- Manual therapy, [phone]- Gait training, Patient/Family education, Balance training, DME instructions, and Cryotherapy  PLAN FOR NEXT SESSION: monitor BP;  How did theraband go on his walker at home?  Review HEP update and progress for strength, balance/weigthshifting; gait training with RW (3# weights on walker), fall prevention, safety education.   Angie Bari, PT 09/18/23 2:11 PM [facility] at [facility] [address] [place], [address] [place], [place] [address] Phone # [phone] Fax # [phone]

## 2023-09-23 ENCOUNTER — Ambulatory Visit: Admitting: Occupational Therapy

## 2023-09-23 ENCOUNTER — Encounter: Payer: Self-pay | Admitting: Physical Therapy

## 2023-09-23 ENCOUNTER — Ambulatory Visit: Admitting: Physical Therapy

## 2023-09-23 DIAGNOSIS — R2681 Unsteadiness on feet: Secondary | ICD-10-CM

## 2023-09-23 DIAGNOSIS — M6281 Muscle weakness (generalized): Secondary | ICD-10-CM

## 2023-09-23 DIAGNOSIS — R270 Ataxia, unspecified: Secondary | ICD-10-CM | POA: Diagnosis not present

## 2023-09-23 DIAGNOSIS — R293 Abnormal posture: Secondary | ICD-10-CM

## 2023-09-23 DIAGNOSIS — R278 Other lack of coordination: Secondary | ICD-10-CM

## 2023-09-23 DIAGNOSIS — R2689 Other abnormalities of gait and mobility: Secondary | ICD-10-CM

## 2023-09-23 NOTE — Patient Instructions (Signed)

## 2023-09-23 NOTE — Therapy (Signed)
 OUTPATIENT PHYSICAL THERAPY NEURO DISCHARGE   Patient Name: Jonathan Hopkins MRN: [id number] DOB:04-15-1985, 38 y.o., male Today's Date: 09/23/2023   PCP: Baird Comer [name], NP REFERRING PROVIDER: Donzetta Rollene BRAVO, MD    Progress Note Reporting Period 08/12/23 to 09/23/23  See note below for Objective Data and Assessment of Progress/Goals.     END OF SESSION:  PT End of Session - 09/23/23 1400     Visit Number 9    Number of Visits 13    Date for PT Re-Evaluation 09/26/23    Authorization Type Medicare Part A&B / [place] Medicaid 2025  Auth: NOT REQUIRED due to Medicare primary  VL: 27 visits combined    Authorization - Visit Number 9    Progress Note Due on Visit 27    PT Start Time 1318    PT Stop Time 1358    PT Time Calculation (min) 40 min    Equipment Utilized During Treatment Gait belt    Activity Tolerance Patient tolerated treatment well    Behavior During Therapy WFL for tasks assessed/performed             Past Medical History:  Diagnosis Date   ADHD    Gait abnormality 01/19/2019   Hypertension    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   Past Surgical History:  Procedure Laterality Date   LAPAROSCOPIC APPENDECTOMY N/A 12/06/2021   Procedure: APPENDECTOMY LAPAROSCOPIC;  Surgeon: Eletha Boas, MD;  Location: [facility];  Service: General;  Laterality: N/A;   THYROID SURGERY     WISDOM TOOTH EXTRACTION     x4   Patient Active Problem List   Diagnosis Date Noted   Frequent falls  R Gluteal Tears 07/24/2023   Abnormality of femur 07/24/2023   Anemia 07/24/2023   Chronic health problem 07/24/2023   Appendicitis 12/05/2021   Gait abnormality 01/19/2019   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 Reports that his PCP wrote him a Rx for w power w/c. Is not sure how to navigate the process of getting one. Pt reports compliance with HEP and denies questions.  Pt accompanied by: self  PERTINENT HISTORY: PMH positive for spinal cerebellar ataxia, ADHD, HTN, anxiety/MDD, insomina.38 y/o male admitted to [facility] 07/23/23 with R LE pain after recent increase in falls. MRI positive for R gluteal muscle tears with plan for WBAT potential operative repair, d/c with recommendation to f/u with orthopedics in 1 week. (Pt reports he has not had follow-up)  PAIN:  Are you having pain? Yes: NPRS scale: no Pain location: R elbow Pain description: sore, swollen Aggravating factors: fall Relieving factors: just giving it time  PRECAUTIONS: Fall and Other: Pt states no restrictions that he knows of; pt unaware of any follow-up  WEIGHT BEARING RESTRICTIONS: No  FALLS: Has patient fallen in last 6 months? Yes. Number of falls 1 fall since hospitalization in April; prior to that, 3-4/day  LIVING ENVIRONMENT: Lives with: lives with their family Lives in: House/apartment Stairs: 1 step into home  Has following equipment at home: Single point cane, Walker - 2 wheeled, shower chair, and Grab bars  PLOF: Independent with household mobility with device  PATIENT GOALS: Pt's goals are to improve strength and coordination  OBJECTIVE:   Per MD appointment on 09/19/23: Prescription written for motorized wheelchair based on recent physical therapist recommendations. With gait instability at high risk for falls falls frequently recent gluteal tears from a fall and hospitalization related to that   TODAY'S TREATMENT:  09/23/23 Activity Comments  vitals 130/89 mmHg, 86bpm   5xSTS 32.64 sec with B UE support and LEs pushing into back of chair   37M walk 25.6 sec with RW (1.43ft/sec)  R knee flexion AROM in sitting 127 deg; pain free    PATIENT EDUCATION: Education details: discussed process of getting power w/c- pt reports that he is agreeable to getting evaluated/measured for a power w/c, thus plan to wrap up with PT for balance today to allow pt to pursue w/c evaluation, progress towards goals and remaining impairments, POC, edu on fall prevention info Person educated: Patient Education method: Explanation, Demonstration, Tactile cues, Verbal cues, and Handouts Education comprehension: verbalized understanding and returned demonstration    HOME EXERCISE PROGRAM: Access Code: [email or web address] URL: [email or web address] Date: 09/09/2023 Prepared by: [facility] - Outpatient  Rehab - [facility]  Program Notes *Look for adjustable weight ankle weights (2-10#)[employer] is an example of a good brand  Exercises - Seated Gluteal Sets  - 1-2 x daily - 7 x weekly - 2 sets - 10 reps - 3 sec hold - Sitting Knee Extension with Resistance  - 1 x daily - 4 x weekly - 3 sets - 10 reps - Seated Hamstring Curl with Anchored Resistance  - 1 x daily - 4 x weekly - 3 sets - 10 reps - Clamshell  - 1 x daily - 7 x weekly - 3 sets - 10 reps - 3 sec hold   ____________________________________________________________________________________________ Note: Objective measures were completed at Evaluation unless otherwise noted.  DIAGNOSTIC FINDINGS: No acute fractures  COGNITION: Overall cognitive status: Within functional limits for tasks assessed   SENSATION: Light touch: WFL  COORDINATION: Slowed alternating foot taps  EDEMA:  Mild swelling noted R ankle  PALPATION:  Along R IT band, along lateral aspect of R knee Ligament tests negative for pain  POSTURE: rounded shoulders, forward head, and  flexed trunk   LOWER EXTREMITY ROM:    Active  Right Eval Left Eval  Hip flexion    Hip extension    Hip abduction    Hip adduction    Hip  internal rotation    Hip external rotation    Knee flexion 115 with pain (sitting)   Knee extension Supine 90-90:  -10 degrees   Ankle dorsiflexion    Ankle plantarflexion    Ankle inversion    Ankle eversion     (Blank rows = not tested)  LOWER EXTREMITY MMT:   MMT Right Eval Left Eval  Hip flexion 4+ 5  Hip extension 3+ 4  Hip abduction 3+   Hip adduction    Hip internal rotation    Hip external rotation    Knee flexion 4+ 5  Knee extension 5 5  Ankle dorsiflexion    Ankle plantarflexion    Ankle inversion    Ankle eversion    (Blank rows = not tested)  TRANSFERS: Sit to stand: Modified independence  Assistive device utilized: None     Stand to sit: Modified independence  Assistive device utilized: decreased eccentric control and None      GAIT: Findings: Gait Characteristics: step through pattern, decreased step length- Right, decreased step length- Left, decreased hip/knee flexion- Right, decreased hip/knee flexion- Left, decreased ankle dorsiflexion- Right, decreased ankle dorsiflexion- Left, ataxic, and wide BOS, Distance walked: 50 ft x 2, Assistive device utilized:Walker - 2 wheeled, Level of assistance: SBA and CGA, and Comments: Pt has increased weightshift and LOB towards R side  FUNCTIONAL TESTS:  5 times sit to stand: 37.72 sec 10 meter walk test: 27.69 sec with RW and supervision (1.18 ft/sec)  GOALS: Goals reviewed with patient? Yes  SHORT TERM GOALS: Target date: 08/29/2023  Pt will be independent with HEP for improved strength, flexibility. Baseline:  No current HEP; initiated 09/02/2023 Goal status: MET  2.  Pt will improve 5x sit<>stand to less than or equal to 30sec to demonstrate improved functional strength and transfer efficiency. Baseline: 37.72 sec Goal status: MET-28.75 seconds  LONG TERM GOALS:  Target date: 09/26/2023  Pt will be independent with HEP for improved strength, balance, gait, flexibility, pain. Baseline:  No current HEP Goal status:MET  2.  Pt will improve 5x sit<>stand to less than or equal to 20sec to demonstrate improved functional strength and transfer efficiency. Baseline: 37.72 sec; 32.64 sec with B UE support and LEs pushing into back of chair 09/23/23 Goal status: NOT MET 09/23/23  3.  Pt will demo improved knee flexion in painfree range by at least 10 degrees. Baseline: 115 degrees in sitting, with pain; 127 deg; pain free 09/23/23 Goal status: MET 09/23/23  4.  Pt will improve gait velocity to at least 1.8 ft/sec for improved gait efficiency and safety. Baseline: 1.18 ft/sec; 1.59ft/sec 09/23/23 Goal status: NOT MET 09/23/23  5.  Pt will verbalize understanding of fall prevention in home environment. Baseline: was falling 3-4 times/day prior to hospitalization; provided edu and handout 09/23/23  Goal status: MET 09/23/23    ASSESSMENT:  CLINICAL IMPRESSION: Patient arrived to session without complaints. 5xSTS testing was slightly quicker today. Able to demonstrate R knee flexion ROM painfree and WNL. Gait speed slightly improved as well but goal not yet met. Patient reports good compliance with HEP and denies questions. At this time, will D/C from PT to allow patient to pursue w/c evaluation. Patient in agreement with this plan. Pt was escorted safely to his car d/t falls risk.   OBJECTIVE IMPAIRMENTS: Abnormal gait, decreased balance, decreased knowledge of use of DME, decreased mobility, difficulty walking, decreased ROM, decreased strength, impaired flexibility, postural dysfunction, and pain.   PLAN:  PT FREQUENCY:  2x/week  PT DURATION: 6 weeks plus eval  PLANNED INTERVENTIONS: 97750- Physical Performance Testing, 97110-Therapeutic exercises, 97530- Therapeutic activity, 97112- Neuromuscular re-education, 97535- Self Care, 02859- Manual therapy, [id number]-  Gait training, Patient/Family education, Balance training, DME instructions, and Cryotherapy  PLAN FOR NEXT SESSION: DC at this time   PHYSICAL THERAPY DISCHARGE SUMMARY  Visits from Start of Care: 9  Current functional level related to goals / functional outcomes: See above clinical impress   Remaining deficits: Imbalance, falls risk   Education / Equipment: HEP, fall prevention edu  Plan: Patient agrees to discharge.  Patient goals were partially met. Patient is being discharged to allow of w/c assessment.       Louana Terrilyn Christians, PT, DPT 09/23/23 2:01 PM  [facility] at [facility] [address] [place], [address] [place], [place] [address] Phone # [phone] Fax # [phone]

## 2023-09-23 NOTE — Therapy (Signed)
 OUTPATIENT OCCUPATIONAL THERAPY NEURO Treatment  Patient Name: Jonathan Hopkins MRN: [id number] DOB:[birth year], 38 y.o., male Today's Date: 09/23/2023  PCP: Baird Comer [name], NP REFERRING PROVIDER: Donzetta Rollene BRAVO, MD  END OF SESSION:  OT End of Session - 09/23/23 1246     Visit Number 8    Number of Visits 11    Date for OT Re-Evaluation 09/26/23    Authorization Type Medicare A&B / [place] Medicaid    Authorization Time Period 27 visits combined    OT Start Time 1232    OT Stop Time 1312    OT Time Calculation (min) 40 min    Activity Tolerance Patient tolerated treatment well    Behavior During Therapy WFL for tasks assessed/performed                Past Medical History:  Diagnosis Date   ADHD    Gait abnormality 01/19/2019   Hypertension    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   Past Surgical History:  Procedure Laterality Date   LAPAROSCOPIC APPENDECTOMY N/A 12/06/2021   Procedure: APPENDECTOMY LAPAROSCOPIC;  Surgeon: Eletha Boas, MD;  Location: [facility];  Service: General;  Laterality: N/A;   THYROID SURGERY     WISDOM TOOTH EXTRACTION     x4   Patient Active Problem List   Diagnosis Date Noted   Frequent falls  R Gluteal Tears 07/24/2023   Abnormality of femur 07/24/2023   Anemia 07/24/2023   Chronic health problem 07/24/2023   Appendicitis 12/05/2021   Gait abnormality 01/19/2019   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  Pt reports that he is unsure the exact series of events, but that he fell hitting his forehead on the top of his chest of drawers.  Pt accompanied by: self (pt drove self)  PERTINENT HISTORY:  PMH positive for spinal cerebellar ataxia, ADHD, HTN, anxiety/MDD, insomina.38 y/o male admitted to [facility] 07/23/23 with R LE pain after recent increase in falls. MRI positive for R gluteal muscle tears with plan for WBAT potential operative repair, d/c with recommendation to f/u with orthopedics in 1 week.  PRECAUTIONS: Fall  WEIGHT BEARING RESTRICTIONS: No, WBAT  PAIN:  Are you having pain? No  FALLS: Has patient fallen in last 6 months? Yes. Number of falls reports falling multiple times per day  08/27/23 - Pt reported fall on 08/26/23 d/t falling backwards while up and walking. Pt had scrapes on L leg and reported only injury from fall. Pt reported able to get up by himself. Pt with fall again on 09/10/23.  LIVING ENVIRONMENT: Lives with: lives with their family (sister and niece) Lives in: House/apartment Stairs: 1 step into home  Has following equipment at home: Vannie - 2 wheeled, Environmental consultant - 4 wheeled, shower chair, Grab bars, and non skid floor in shower  PLOF: Independent and Independent with basic ADLs  PATIENT GOALS: [facility] skills to open food containers and separate coffee filters, button pants  OBJECTIVE:  Note: Objective measures were completed at Evaluation unless otherwise noted.  HAND DOMINANCE: Right  ADLs: Transfers/ambulation related to ADLs: Mod I with RW, reports frequent falls Eating: difficulty with cutting and scooping foods,  Grooming: decreased motor control when brushing  teeth and shaving UB Dressing: Mod I, in sitting LB Dressing: difficulty with buttoning pants, belt, tying shoes (tends to just slip shoes on/off) Toileting: Mod I - has a grab bar next to toilet Bathing: Mod I - using shower chair and grab bars Tub Shower transfers: Mod I - using grab bars Equipment: Shower seat with back, Grab bars, and Walk in shower  IADLs: Light housekeeping: challenging as he was using a cane before recent hospitalization Meal Prep: uses microwave and makes smaller  meals Community mobility: driving Medication management: Mod I - utilizing pill box Handwriting: 80% legibility; pt writing (whales live in a blue ocean) in 30.85 seconds with large sizing and no regard to lined paper, some letters way above and some below lines.  MOBILITY STATUS: Needs Assist: CGA to Min A with ambulation from treatment room to front of clinic due to 2 LOB and Hx of falls  POSTURE COMMENTS:  rounded shoulders and forward head  ACTIVITY TOLERANCE: Activity tolerance: WFL for tasks assessed on eval  FUNCTIONAL OUTCOME MEASURES: PSFS  UPPER EXTREMITY ROM:  WFL bilaterally, pt with mild delayed speed and proprioception on LUE   HAND FUNCTION: Grip strength: Right: 100 lbs; Left: 95 lbs  COORDINATION: Finger Nose Finger test: dysmetria bilaterally L> R 9 Hole Peg test: Right: 81.06 sec; Left: 89.12 sec - pt dropping 1 peg on L, 2 on R Box and Blocks:  Right 23 blocks, Left 19 blocks RAM: symmetrical when completed slowly, disdiadochokinesia with RAM.  SENSATION: Numbness/tingling through LUE  COGNITION: Overall cognitive status: Within functional limits for tasks assessed  VISION: Subjective report: double vision, wears glasses with prisms to decrease double vision Baseline vision: Wears glasses all the time  VISION ASSESSMENT: To be further assessed in functional context  PRAXIS: Impaired: Motor planning  OBSERVATIONS: Pt required Min A with ambulation with RW due to multiple LOB.  Pt demonstrating significant difficulty with bilateral tasks and coordination, LUE> RUE.                                                                                                                             TREATMENT DATE:  09/23/23 Gross and fine motor control: engaged in sorting cards by sliding off deck with thumb and then sorting with RUE to set up pyramid card task.  OT reiterating recommendation and education to keep arms in by side to increase motor control.   Educated on supporting arm at forearm when reaching further outside body. Coffee filters: OT recommended wetting tips of finger tips, pt then able to slide thumb against finger tips (with filter between thumb and finger tips) - pt then able to separate filters with significantly increased ease.  Card activity: reiterated above strategy with coffee filters with use of playing cards.  Pt holding cards in palm of hand and sliding off deck with thumb as well as flipping cards from stack bringing finger tips towards thumb to grasp card off top of  stack.  OT encouraging completion of each to carry over to patient specific goal of separating coffee filters.   Hand weights: OT provided pt with pictures of wrist and hand weights to aid in decreasing ataxia and improving motor control when reaching for and/or manipulating items.  Pt reporting understanding of potential benefit.     09/18/23 Pegs: engaged in small peg board pattern activity with focus on coordination and motor control.  OT educating on keeping elbow in by side to increase stability and reduce ataxia - as pt frequently hiking shoulder and holding arm in abduction/away from body.  Pt with mild increase in motor control from a reach standpoint with elbow in by side for abducted.  Tray of pegs sliding, therefore OT educated on Dycem to decrease sliding/slippage and pt benefiting from use to allow for focus on motor control while not worrying about tray moving against/opposite ataxia.   Reducing action tremors: OT providing pt with handout of strategies to reduce action tremor, reiterating recommendations to keep arms in by side, bracing against body or table as able, supporting at forearm or wrist, and use of weighted bowls/utensils.  OT also educating on impact of stress on body movements and ability to focus on compensation strategies.   Buttons: massed practice with buttoning and unbuttoning with OT providing cues to keep arms in closer to side to  increase motor control.  Pt still demonstrating shoulder hike and arms horizontally abducted, despite cues to keep arms in closer to side.  Pt completed 3 button/unbutton in 59.66 sec. Handwriting: pt writing 10 items on lined paper with slightly weighted pen with 100% legibility.  Pt still with larger print, but much better adherence to line placement and legibility.       09/11/23 BP 139/80 with HR of 102 Pt reporting fall yesterday, stating no new acute pain but continues to have some discomfort from pre-existing glute tear Strengthening: pt performing velcro resistance various sizes and shapes for key turning, rolling single hand, and turning for L/R hand all for 2 rounds each direction to improve grip strength, forearm extensor/flexors. Pt performing seated BUE strengthening with 4# dumbbells for weight bearing, modified as needed for curls, chest press, and single UE reaching and placing of weighted item. FMC/grip strength: 50#resistance on gripper for 10 each hand prior to using gripper to remove 15 pegs each hand while using alternate hand as a stabilizer.  GMC: stacking cups game with 10/10 cones with knocking over 1x, to improve gross motor skills and reaching away from the body.    PATIENT EDUCATION: Education details: see today's treatment above Person educated: Patient Education method: Explanation and Handouts Education comprehension: verbalized understanding and needs further education  HOME EXERCISE PROGRAM: 08/27/23 - FM coordination HEP (see pt instructions, handout provided)   GOALS: Goals reviewed with patient? Yes  SHORT TERM GOALS: Target date: 09/05/23  Pt will be independent with [facility] and GMC HEP with use of visual handouts. Baseline: new to OPOT Goal status: in progress  2.  Pt will verbalize understanding of recommendations for adaptive strategies, AE/DME, and/or modifications to home setup to increase independence and safety with ability to complete ADLs.   Baseline: pt with frequent falls Goal status: in progress  3.  Pt will recall at least 3 sensory safety precautions and compensations to improve safety during functional tasks.  Baseline: impaired sensation LUE> RUE Goal status: INITIAL   LONG TERM GOALS: Target date: 09/26/23  Pt will demonstrate improved gross motor coordination to increase  independence with ADLs and IADLs as evidenced by improved score on Box and Blocks assessment by 8 blocks bilaterally.  Baseline: Right 23 blocks, Left 19 blocks 09/09/23: Left: 23 blocks, Right: 30 blocks Goal status: in progress  2.  Pt will demonstrate improved fine motor coordination for ADLs as evidenced by decreasing 9 hole peg test score by 10 secs bilaterally. Baseline: Right: 81.06 sec; Left: 89.12 sec - pt dropping 1 peg on L, 2 on R Goal status: in progress  3.  Pt will report and/or demonstrate improved coordination to manage clothing fasteners (pants button) by decreasing score on 3 button/unbutton assessment to 45 seconds or less.  Baseline: 54.56 sec 09/18/23: 59.68 sec Goal status: in progress  4.  Pt will write a 10-15 item grocery list with 100% legibility and good adherence to lines and spacing. Baseline: 80% legibility; pt writing (whales live in a blue ocean) in 30.85 seconds with large sizing and no regard to lined paper, some letters way above and some below lines. Goal status: INITIAL  5.  Patient will report at least two-point increase in average PSFS score or at least three-point increase in a single activity score indicating functionally significant improvement given minimum detectable change. 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  Pt continues to engage in therapy sessions and appreciative of accommodations, strategies, and/or additional devices to improve functional use of BUE.  Pt  with good recall of keeping arms in closer to body during reaching, however still with some deviation from this position.  Pt educated on limited visit coverage per insurer and encouraged to consider d/c vs re-cert at next session.    PERFORMANCE DEFICITS: in functional skills including ADLs, IADLs, coordination, proprioception, sensation, pain, Fine motor control, Gross motor control, balance, body mechanics, endurance, decreased knowledge of precautions, decreased knowledge of use of DME, vision, and UE functional use and psychosocial skills including coping strategies, environmental adaptation, and routines and behaviors.    PLAN:  OT FREQUENCY: 1-2x/week  OT DURATION: 6 weeks  PLANNED INTERVENTIONS: 97168 OT Re-evaluation, 97535 self care/ADL training, 02889 therapeutic exercise, 97530 therapeutic activity, 97112 neuromuscular re-education, functional mobility training, visual/perceptual remediation/compensation, psychosocial skills training, energy conservation, coping strategies training, patient/family education, and DME and/or AE instructions  RECOMMENDED OTHER SERVICES: may benefit from SLP evaluation due to report of swallowing issues  CONSULTED AND AGREED WITH PLAN OF CARE: Patient  PLAN FOR NEXT SESSION:    d/c vs re-cert at next session.  Monitor BP  Review FM coordination HEP PRN  initiate  GMC HEP  WB activities for motor control  education on AE/DME   KAYLENE DOMINO, OTR/L 09/23/2023, 4:11 PM   [facility] at [facility] [address] [place], [place] [address] Phone # [phone] Fax # [phone]

## 2023-09-25 ENCOUNTER — Ambulatory Visit: Admitting: Occupational Therapy

## 2023-09-25 DIAGNOSIS — M6281 Muscle weakness (generalized): Secondary | ICD-10-CM

## 2023-09-25 DIAGNOSIS — R270 Ataxia, unspecified: Secondary | ICD-10-CM

## 2023-09-25 DIAGNOSIS — R278 Other lack of coordination: Secondary | ICD-10-CM

## 2023-09-25 DIAGNOSIS — R293 Abnormal posture: Secondary | ICD-10-CM

## 2023-09-25 NOTE — Therapy (Signed)
 OUTPATIENT OCCUPATIONAL THERAPY NEURO Treatment  Patient Name: Jonathan Hopkins MRN: [id number] DOB:1985-05-01, 38 y.o., male Today's Date: 09/25/2023  PCP: Jonathan Comer [name], NP REFERRING PROVIDER: Donzetta Rollene BRAVO, MD  END OF SESSION:  OT End of Session - 09/25/23 1409     Visit Number 9    Number of Visits 11    Date for OT Re-Evaluation 09/26/23    Authorization Type Medicare A&B / [place] [employer]    Authorization Time Period 27 visits combined    OT Start Time 1404    OT Stop Time 1452    OT Time Calculation (min) 48 min    Activity Tolerance Patient tolerated treatment well    Behavior During Therapy WFL for tasks assessed/performed                 Past Medical History:  Diagnosis Date   ADHD    Gait abnormality 01/19/2019   Hypertension    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   Past Surgical History:  Procedure Laterality Date   LAPAROSCOPIC APPENDECTOMY N/A 12/06/2021   Procedure: APPENDECTOMY LAPAROSCOPIC;  Surgeon: Jonathan Boas, MD;  Location: [facility];  Service: General;  Laterality: N/A;   THYROID SURGERY     WISDOM TOOTH EXTRACTION     x4   Patient Active Problem List   Diagnosis Date Noted   Frequent falls  R Gluteal Tears 07/24/2023   Abnormality of femur 07/24/2023   Anemia 07/24/2023   Chronic health problem 07/24/2023   Appendicitis 12/05/2021   Gait abnormality 01/19/2019   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  Pt accompanied by: self (pt drove self)  PERTINENT HISTORY: PMH positive for spinal cerebellar ataxia, ADHD, HTN,  anxiety/MDD, insomina.38 y/o male admitted to [facility] 07/23/23 with R LE pain after recent increase in falls. MRI positive for R gluteal muscle tears with plan for WBAT potential operative repair, d/c with recommendation to f/u with orthopedics in 1 week.  PRECAUTIONS: Fall  WEIGHT BEARING RESTRICTIONS: No, WBAT  PAIN:  Are you having pain? No  FALLS: Has patient fallen in last 6 months? Yes. Number of falls reports falling multiple times per day  08/27/23 - Pt reported fall on 08/26/23 d/t falling backwards while up and walking. Pt had scrapes on L leg and reported only injury from fall. Pt reported able to get up by himself. Pt with fall again on 09/10/23.  LIVING ENVIRONMENT: Lives with: lives with their family (sister and niece) Lives in: House/apartment Stairs: 1 step into home  Has following equipment at home: Vannie - 2 wheeled, Environmental consultant - 4 wheeled, shower chair, Grab bars, and non skid floor in shower  PLOF: Independent and Independent with basic ADLs  PATIENT GOALS: [facility] skills to open food containers and separate coffee filters, button pants  OBJECTIVE:  Note: Objective measures were completed at Evaluation unless otherwise noted.  HAND DOMINANCE: Right  ADLs: Transfers/ambulation related to ADLs: Mod I with RW, reports frequent falls Eating: difficulty with cutting and scooping foods,  Grooming: decreased motor control when brushing teeth and shaving UB Dressing: Mod I, in sitting LB Dressing: difficulty with buttoning pants,  belt, tying shoes (tends to just slip shoes on/off) Toileting: Mod I - has a grab bar next to toilet Bathing: Mod I - using shower chair and grab bars Tub Shower transfers: Mod I - using grab bars Equipment: Shower seat with back, Grab bars, and Walk in shower  IADLs: Light housekeeping: challenging as he was using a cane before recent hospitalization Meal Prep: uses microwave and makes smaller meals Community mobility: driving Medication  management: Mod I - utilizing pill box Handwriting: 80% legibility; pt writing (whales live in [place] blue ocean) in 30.85 seconds with large sizing and no regard to lined paper, some letters way above and some below lines.  MOBILITY STATUS: Needs Assist: CGA to Min A with ambulation from treatment room to front of clinic due to 2 LOB and Hx of falls  POSTURE COMMENTS:  rounded shoulders and forward head  ACTIVITY TOLERANCE: Activity tolerance: WFL for tasks assessed on eval  FUNCTIONAL OUTCOME MEASURES: PSFS eval   09/25/23   UPPER EXTREMITY ROM:  WFL bilaterally, pt with mild delayed speed and proprioception on LUE   HAND FUNCTION: Grip strength: Right: 100 lbs; Left: 95 lbs  COORDINATION: Finger Nose Finger test: dysmetria bilaterally L> R 9 Hole Peg test: Right: 81.06 sec; Left: 89.12 sec - pt dropping 1 peg on L, 2 on R Box and Blocks:  Right 23 blocks, Left 19 blocks RAM: symmetrical when completed slowly, disdiadochokinesia with RAM.  SENSATION: Numbness/tingling through LUE  COGNITION: Overall cognitive status: Within functional limits for tasks assessed  VISION: Subjective report: double vision, wears glasses with prisms to decrease double vision Baseline vision: Wears glasses all the time  VISION ASSESSMENT: To be further assessed in functional context  PRAXIS: Impaired: Motor planning  OBSERVATIONS: Pt required Min A with ambulation with RW due to multiple LOB.  Pt demonstrating significant difficulty with bilateral tasks and coordination, LUE> RUE.                                                                                                                             TREATMENT DATE:  09/25/23 Engaged in discussion about current/remaining impairments, prognosis, and insurance visit limit.  Pt initially expressing desire to continue working with OT on coordination, but after education on AE, alterative strategies, and modifications to routines pt in agreement  to try a few and save remaining visits for later in the year.  Fine and gross motor control: see below for details with managing buttons, 9 hole peg test, and box and blocks  Pt demonstrating much improved motor control when maintaining arms in by side to increase motor control.  Pt also with use of button hook to increase ease with managing buttons.   Self-care: Educated on button hook, zipper hook, jar opener, use of electric toothbrush or razor, pull over shirts, elastic laces and waist band on pants, magnetic closures on button up shirts and jackets to aid in ease and increase independence with ataxia.  Provided with handouts and images of adaptive equipment.  Provided with handout for joint protection and strategies to reduce tremors.  Educated pt on using scissors to open containers and use of BUE when opening jars/bottles and use of zip closure on plastic bag to increase ease with opening/closing. Grip strength: educated on use of hand gripper and/or stress ball for grip strengthening.    09/23/23 Gross and fine motor control: engaged in sorting cards by sliding off deck with thumb and then sorting with RUE to set up pyramid card task.  OT reiterating recommendation and education to keep arms in by side to increase motor control.  Educated on supporting arm at forearm when reaching further outside body. Coffee filters: OT recommended wetting tips of finger tips, pt then able to slide thumb against finger tips (with filter between thumb and finger tips) - pt then able to separate filters with significantly increased ease.  Card activity: reiterated above strategy with coffee filters with use of playing cards.  Pt holding cards in palm of hand and sliding off deck with thumb as well as flipping cards from stack bringing finger tips towards thumb to grasp card off top of stack.  OT encouraging completion of each to carry over to patient specific goal of separating coffee filters.   Hand weights: OT  provided pt with pictures of wrist and hand weights to aid in decreasing ataxia and improving motor control when reaching for and/or manipulating items.  Pt reporting understanding of potential benefit.     09/18/23 Pegs: engaged in small peg board pattern activity with focus on coordination and motor control.  OT educating on keeping elbow in by side to increase stability and reduce ataxia - as pt frequently hiking shoulder and holding arm in abduction/away from body.  Pt with mild increase in motor control from a reach standpoint with elbow in by side for abducted.  Tray of pegs sliding, therefore OT educated on Dycem to decrease sliding/slippage and pt benefiting from use to allow for focus on motor control while not worrying about tray moving against/opposite ataxia.   Reducing action tremors: OT providing pt with handout of strategies to reduce action tremor, reiterating recommendations to keep arms in by side, bracing against body or table as able, supporting at forearm or wrist, and use of weighted bowls/utensils.  OT also educating on impact of stress on body movements and ability to focus on compensation strategies.   Buttons: massed practice with buttoning and unbuttoning with OT providing cues to keep arms in closer to side to increase motor control.  Pt still demonstrating shoulder hike and arms horizontally abducted, despite cues to keep arms in closer to side.  Pt completed 3 button/unbutton in 59.66 sec. Handwriting: pt writing 10 items on lined paper with slightly weighted pen with 100% legibility.  Pt still with larger print, but much better adherence to line placement and legibility.     PATIENT EDUCATION: Education details: see today's treatment above Person educated: Patient Education method: Explanation and Handouts Education comprehension: verbalized understanding and needs further education  HOME EXERCISE PROGRAM: 08/27/23 - FM coordination HEP (see pt instructions, handout  provided)   GOALS: Goals reviewed with patient? Yes  SHORT TERM GOALS: Target date: 09/05/23  Pt will be independent with [facility] and [facility] HEP with use of visual handouts. Baseline: new to OPOT Goal status: in progress  2.  Pt will verbalize understanding of recommendations for adaptive strategies, AE/DME, and/or modifications to home setup to increase independence  and safety with ability to complete ADLs.  Baseline: pt with frequent falls Goal status: in progress  3.  Pt will recall at least 3 sensory safety precautions and compensations to improve safety during functional tasks.  Baseline: impaired sensation LUE> RUE Goal status: INITIAL   LONG TERM GOALS: Target date: 09/26/23  Pt will demonstrate improved gross motor coordination to increase independence with ADLs and IADLs as evidenced by improved score on Box and Blocks assessment by 8 blocks bilaterally.  Baseline: Right 23 blocks, Left 19 blocks 09/09/23: Left: 23 blocks, Right: 30 blocks 09/25/23: Left: 29 blocks and Right:31 blocks Goal status: MET   2.  Pt will demonstrate improved fine motor coordination for ADLs as evidenced by decreasing 9 hole peg test score by 10 secs bilaterally. Baseline: Right: 81.06 sec; Left: 89.12 sec - pt dropping 1 peg on L, 2 on R 09/25/23: Right: 65.81 sec and Left: 75.25 sec - pt dropping 1 peg on L, 1 peg R Goal status: MET  3.  Pt will report and/or demonstrate improved coordination to manage clothing fasteners (pants button) by decreasing score on 3 button/unbutton assessment to 45 seconds or less.  Baseline: 54.56 sec 09/18/23: 59.68 sec 09/24/24: 65.06 sec without button hook and 45 seconds with use of button hook Goal status: MET with use of button hook  4.  Pt will write a 10-15 item grocery list with 100% legibility and good adherence to lines and spacing. Baseline: 80% legibility; pt writing (whales live in a blue ocean) in 30.85 seconds with large sizing and no regard to lined paper,  some letters way above and some below lines. Goal status: INITIAL  5.  Patient will report at least two-point increase in average PSFS score or at least three-point increase in a single activity score indicating functionally significant improvement given minimum detectable change. 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  Pt has demonstrated good improvements with use of adaptive strategies, keeping arms closer in to trunk and utilizing adaptive equipment (button hook, dycem, etc).  Due to progressive nature of condition and limited visits covered by insurance, pt in agreement to hold on therapy ~30 days to assess engagement in adaptive techniques and carry over of education from previous sessions into daily routines.  Pt has met 4 of 5 LTGs with use of AE or adaptive strategies.  Pt encouraged to call back before that time with any changes or if any relevant functional deficits develop/occur. Pt to be discharged from OP OT if no further therapy is warranted after ~30 days.    PERFORMANCE DEFICITS: in functional skills including ADLs, IADLs, coordination, proprioception, sensation, pain, Fine motor control, Gross motor control, balance, body mechanics, endurance, decreased knowledge of precautions, decreased knowledge of use of DME, vision, and UE functional use and psychosocial skills including coping strategies, environmental adaptation, and routines and behaviors.    PLAN:  OT FREQUENCY: 1-2x/week  OT DURATION: 6 weeks  PLANNED INTERVENTIONS: 97168 OT Re-evaluation, 97535 self care/ADL training, 02889 therapeutic exercise, 97530 therapeutic activity, 97112 neuromuscular re-education, functional mobility training, visual/perceptual remediation/compensation, psychosocial skills training, energy conservation,  coping strategies training, patient/family education, and DME and/or AE instructions  RECOMMENDED OTHER SERVICES: may benefit from SLP evaluation due to report of swallowing issues  CONSULTED AND AGREED WITH PLAN OF CARE: Patient  PLAN FOR  NEXT SESSION:    d/c vs re-cert at next session.  Monitor BP  Review FM coordination HEP PRN  initiate  [facility] HEP  WB activities for motor control  education on AE/DME   KAYLENE DOMINO, OTR/L 09/25/2023, 3:01 PM   [facility] at [facility] [address], [address] [place], [place] [address] Phone # [phone] Fax # [phone]

## 2023-09-26 NOTE — Patient Instructions (Signed)
 SABRA

## 2023-09-30 ENCOUNTER — Ambulatory Visit: Admitting: Occupational Therapy

## 2023-10-02 ENCOUNTER — Ambulatory Visit: Admitting: Occupational Therapy

## 2023-10-27 ENCOUNTER — Ambulatory Visit: Admitting: Physical Therapy

## 2023-11-18 ENCOUNTER — Ambulatory Visit: Attending: Adult Health Nurse Practitioner | Admitting: Physical Therapy

## 2023-11-18 DIAGNOSIS — R270 Ataxia, unspecified: Secondary | ICD-10-CM | POA: Insufficient documentation

## 2023-11-18 DIAGNOSIS — R2689 Other abnormalities of gait and mobility: Secondary | ICD-10-CM | POA: Insufficient documentation

## 2023-11-18 DIAGNOSIS — M6281 Muscle weakness (generalized): Secondary | ICD-10-CM | POA: Insufficient documentation

## 2023-11-18 DIAGNOSIS — R278 Other lack of coordination: Secondary | ICD-10-CM | POA: Insufficient documentation

## 2023-11-18 NOTE — Therapy (Unsigned)
 OUTPATIENT PHYSICAL THERAPY WHEELCHAIR EVALUATION   Patient Name: Jonathan Hopkins MRN: [id number] DOB:[birth year], 38 y.o., male Today's Date: 11/20/2023  END OF SESSION:  PT End of Session - 11/20/23 1938     Visit Number 1    Number of Visits 1    Authorization Type Medicare/Medicaid    PT Start Time 1316    PT Stop Time 1428    PT Time Calculation (min) 72 min    Activity Tolerance Patient tolerated treatment well    Behavior During Therapy [facility] for tasks assessed/performed          Past Medical History:  Diagnosis Date   ADHD    Gait abnormality 01/19/2019   Hypertension    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   Past Surgical History:  Procedure Laterality Date   LAPAROSCOPIC APPENDECTOMY N/A 12/06/2021   Procedure: APPENDECTOMY LAPAROSCOPIC;  Surgeon: Eletha Boas, MD;  Location: [facility];  Service: General;  Laterality: N/A;   THYROID SURGERY     WISDOM TOOTH EXTRACTION     x4   Patient Active Problem List   Diagnosis Date Noted   Frequent falls  R Gluteal Tears 07/24/2023   Abnormality of femur 07/24/2023   Anemia 07/24/2023   Chronic health problem 07/24/2023   Appendicitis 12/05/2021   Gait abnormality 01/19/2019   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    PCP: Baird Comer [name], NP  REFERRING PROVIDER: Baird Comer [name], NP  REFERRING DIAG: G11.8 (ICD-10-CM) - Other hereditary ataxias R26.81 (ICD-10-CM) - Unsteadiness on feet Z91.81 (ICD-10-CM) - History of falling R29.6 (ICD-10-CM) - Repeated falls  THERAPY DIAG:  Other abnormalities of gait and mobility  Muscle weakness (generalized)  Ataxia  Other lack of coordination  ONSET DATE: 2019 for diagnosis of SCA-3:  Referral date 09-23-23  Rationale for Evaluation and Treatment: Habilitation  SUBJECTIVE:                                                                                                                                                                                            SUBJECTIVE STATEMENT: Pt has SCA-3 which was diagnosed in May 2019; pt is using standard RW with very unsteady, ataxic gait pattern;  pt states he uses rollator in his home because he feels more stable with this device than with RW - uses standard RW in community (with assistance) because it is easier to transport   PERTINENT HISTORY:  Spinocerebellar ataxia type 3; ADHD, overactive bladder, HTN, anxiety , peripheral neuropathy, depression   PAIN:  Are you having pain? Yes: NPRS scale: 12/10 Pain location: Neck Pain description: throbbing, aching  Aggravating factors: unknown Relieving factors: medication  PRECAUTIONS: Fall  WEIGHT BEARING RESTRICTIONS: No  FALLS:  Has patient fallen in last 6 months? Yes. Number of falls approx. 45-50  LIVING ENVIRONMENT: Lives with: lives with their family Lives in: House/apartment Stairs: Yes: External: 1 steps; none Has following equipment at home: Single point cane, Walker - 2 wheeled, Environmental consultant - 4 wheeled, shower chair, and Grab bars   PLOF: Needs assistance with ADLs, Needs assistance with homemaking, and Needs assistance with gait  PATIENT GOALS: obtain power wheelchair for independence and safety with mobility   PATIENT INFORMATION: This Evaluation form will serve as the LMN for the following suppliers:  Supplier:  [employer]  Contact Person:  Selinda Bach, ATP Phone:  [phone]   Reason for Referral: power wheelchair evaluation Patient/caregiver Goals:  obtain power wheelchair for independence and safety with mobility  Patient was seen for face-to-face evaluation for new power wheelchair.  Also present was Selinda Bach, ATP, with [employer] to discuss recommendations and wheelchair options.  Further paperwork was completed and sent to vendor.  Patient appears to qualify for power mobility device at this time per objective findings.   MEDICAL HISTORY: Diagnosis:  SCA- 3 (Spinocerebellar  ataxia type 3);  Cervical Radiculopathy  Primary Diagnosis Onset:  May 2019 [x] Progressive Disease Relevant Past and Future Surgeries:  unremarkable  Height:  6'1  Weight:  175# Explain and recent changes or trends in weight:   Relevant History including falls: Pt was diagnosed with SCA-3 in May 2019:  pt reports he has 1-2 falls/week (average 48-50 falls in past 6 months):  reports his most recent fall occurred 3 days ago.   Pt reports he started using RW in late April when he went to ED with gluteal tear sustained from a fall - says he was using a SPC prior to this injurious fall.  Pt needed mod assist for balance recovery when standing up in lobby to ambulate to clinic gym - using RW.     Per chart note 07-23-23 PMH positive for spinal cerebellar ataxia, ADHD, HTN, anxiety/MDD, insomina.38 y/o male admitted [facility]23/23 with R LE pain after recent increase in falls. MRI positive for R gluteal muscle tears with plan for WBAT potential operative repair   HOME ENVIRONMENT: [] House  [x] Condo/town home  [] Apartment  [] Assisted Living    [] Lives Alone [x]  Lives with Others                                                    Hours with caregiver:  24'   [x] Home is accessible to patient            Stairs  [x] Yes []  No   2 steps to enter into townhome  Ramp [] Yes [x] No Comments:     COMMUNITY ADL: TRANSPORTATION: [x] Car    [] Solicitor    [] Adapted w/c Lift   [] Ambulance   [x] Other: Medical transportation; uses vehicles of friends, family for transportation [] Sits in wheelchair during transport  Employment/School:     Specific requirements pertaining to mobility  Other:                                       FUNCTIONAL/SENSORY PROCESSING SKILLS:  Handedness:   [x] Right     [] Left    [] NA  Comments:                                 Functional Processing Skills for Wheeled Mobility [x] Processing Skills are  adequate for safe wheelchair operation  Areas of concern than may interfere with safe operation of wheelchair Description of problem   []  Attention to environment     [] Judgment     []  Hearing  []  Vision or visual processing    [] Motor Planning  []  Fluctuations in Behavior                                                   VERBAL COMMUNICATION: [x] WFL receptive [x]  WFL expressive [] Understandable  [] Difficult to understand  [] non-communicative []  Uses an augmented communication device    CURRENT SEATING / MOBILITY: Current Mobility Base:   [x] None  [] Dependent  [] Manual  [] Scooter  [] Power   Type of Control:                       Manufacturer:                         Size:                         Age:                           Current Condition of Mobility Base:                                                                                                                     Current Wheelchair components:  Describe posture in present seating system:                                                                            SENSATION and SKIN ISSUES: Sensation [] Intact [x] Impaired - in LUE due to cervical radiculopathy; tingling in bil. LE's at night [] Absent   Level of sensation:                           Pressure Relief: Able to perform effective pressure relief :   [] Yes  [x]  No Method:                                                                              If not, Why?:  Due to ataxia in bil. LE's and truncal ataxia, decreased LUE strength and decreased standing balance                                                                        Skin Issues/Skin Integrity Current Skin Issues   [] Yes [x] No  [] Intact []  Red area []  Open Area  [] Scar Tissue [x] At risk from prolonged sitting  Where                              History  of Skin Issues   [] Yes [x] No  Where                                         When                                               Hx of skin flap surgeries [] Yes [x] No  Where                  When                                                  Limited sitting tolerance [] Yes [x] No Hours spent sitting in wheelchair daily:   pt reports he usually sits on sofa  Complaint of Pain:  Please describe:     pt reports neck pain is severe,  12 on a scale 1-10                                                                                                      Swelling/Edema:   pt reports intermittent edema in lower legs/ankles - none noted at today's evaluation                                                                                                                                      ADL STATUS (in reference to wheelchair use):  Indep Assist Unable Indep with Equip Not assessed Comments  Dressing                               X                         Dresses from a seated position; slip on shoes to avoid having to tie shoes                 Eating     X           X                                        Assistance needed with cutting tough foods                                                                     Toileting                                        X                       Grab bars, BSC  Bathing                                      X                         Shower chair; has walk in shower                                                                       Grooming/ Hygiene                                   X                        Holds onto sink with 1 arm for assist with balance                                                                 Meal Prep                            X                                                                                               IADLS              X                                                                                                   Bowel Management: [x] Continent  [] Incontinent  [] Accidents Comments:                                                  Bladder Management: [x] Continent  [] Incontinent  [] Accidents Comments:  Pt needs assistance with safety - is at very high fall risk with performing ADL's independently    WHEELCHAIR SKILLS: Manual w/c Propulsion: [] UE or LE strength and endurance sufficient to participate in ADLs using manual wheelchair Arm :  [] left [] right  [] Both                                   Foot:   [] left [] right  [] Both  Distance:   Operate Scooter: []  Strength, hand grip, balance and transfer appropriate for use [] Living environment is accessible for use of scooter  Operate Power w/c:  [x]  Std. Joystick   []  Alternative Controls Indep [x]  Assist []  Dependent/ Unable []  N/A []  [x] Safe          [x]  Functional      Distance:   50'             Bed confined without wheelchair []  Yes [x]  No - but is at HIGH fall risk with ambulation due to ataxia/decreased standing balance and decreased motor control   STRENGTH/RANGE OF MOTION:  Range of Motion Strength  Shoulder    WFL's bil. UE's                                               RUE 4/5; LUE 4-/5 with c/o pain with resistance                                                          Elbow  WNL's bil. LE's                                                 WNL's bil. UE's                                                             Wrist/Hand    WNL's bil. UE's                                                                RUE WNL's: LUE WFL's with c/o pain due to cervical radiculopathy                                                                     Hip    WFL's bil. LE's  Bil. Hip flexors 4/5                                                          Knee    WNL's bil. LE's                                                           Bil. Quads 5/5: bil. Hamstrings 4/5                                                               Ankle WNL's bil. LE's     Bil. Dorsiflexors 4+/5                                                                 MOBILITY/BALANCE:  []  Patient is totally dependent for mobility                                                                                               Balance Transfers Ambulation  Sitting Balance: Standing Balance: []  Independent []  Independent/Modified Independent  []  WFL     []  WFL []  Supervision []  Supervision  [x]  Uses UE for balance  []  Supervision [x]  Min Assist [x]  Ambulates with Assist                           []  Min Assist []  Min assist []  Mod Assist [x]  Ambulates with Device:  [x]  RW   []  StW   []  Cane   []                 []  Mod Assist [x]  Mod assist []  Max assist   []  Max Assist []  Max assist []  Dependent []  Indep. Short Distance Only  []  Unable []  Unable []  Lift / Sling Required Distance (in feet)  approx. 20'                           []  Sliding board []  Unable to Ambulate: (Explain:  Cardio Status:  [x] Intact  []  Impaired   []  NA                              Respiratory Status:  [  x]Intact   [] Impaired   [] NA                                     Orthotics/Prosthetics:    None                                                                     Comments (Address manual vs power w/c vs scooter):  Pt is unable to safely and effectively propel a manual wheelchair due to ataxia and decreased coordination due to diagnosis of SCA-3.  Pt also unable to propel a manual wheelchair due to c/o severe neck & LUE pain and weakness due to cervical radiculopathy.  Pt requires a power wheelchair with power tilt and a seat elevator to increase independence and safety with mobility and ADL's in his home environment.  Pt is unable to stand unsupported and is at extremely high risk for fall  with ambulation with use of RW due to ataxia, decreased coordination and decreased motor control.  Power tilt will enable independence and safe performance of adequate pressure relief without having to transfer out of wheelchair, minimizing fall risk with change in position.  A seat elevator will enable independence and increase accessibility with safely reaching items needed for feeding, self care, etc.; a power seat elevator will also increase safety with transfers to unlevel surfaces.  Pt is unable to operate a scooter due to his inability to safely transfer on and off the platform of a scooter.  A group 3 power wheelchair is medically necessary for safety & independence with mobility for this patient.                                         Anterior / Posterior Obliquity Rotation-Pelvis  PELVIS    [x] Neutral  []  Posterior  []  Anterior     [x] WFL  [] Right Elevated  [] Left Elevated   [x] WFL  [] Right Anterior []   Left Anterior    []  Fixed []  Partly Flexible [x]  Flexible  []  Other  []  Fixed  []  Partly Flexible  [x]  Flexible []  Other  []  Fixed  []  Partly Flexible  [x]  Flexible []  Other  TRUNK [x] WFL [] Thoracic Kyphosis [] Lumbar Lordosis   [x]  WFL [] Convex Right [] Convex Left   [] c-curve [] s-curve [] multiple  [x]  Neutral []  Left-anterior []  Right-anterior    []  Fixed [x]  Flexible []  Partly Flexible       Other  []  Fixed [x]  Flexible []  Partly Flexible []  Other  []  Fixed           [x]  Flexible []  Partly Flexible []  Other   Position Windswept   HIPS  [x]  Neutral []  Abduct []  ADduct [x]  Neutral []  Right []  Left       []  Fixed  []  Partly Flexible             []  Dislocated [x]  Flexible []  Subluxed    []  Fixed []  Partly Flexible  [x]  Flexible []  Other              Foot Positioning Knee Positioning   Knees  and  Feet  [x]  WFL [x] Left [x] Right [x]  WFL [x] Left [x] Right   KNEES ROM concerns: ROM concerns:   & Dorsi-Flexed                     [] Lt [] Rt                                  FEET Plantar Flexed                  [] Lt [] Rt     Inversion                    [] Lt [] Rt     Eversion                    [] Lt [] Rt    HEAD []  Functional []  Good Head Control   & []  Flexed         []  Extended [x]  Adequate Head Control   NECK []  Rotated  Lt  [x]  Lat Flexed Lt []  Rotated  Rt []  Lat Flexed Rt []  Limited Head Control    []  Cervical Hyperextension []  Absent  Head Control    SHOULDERS ELBOWS WRIST& HAND         Left     Right    Left     Right  U/E [] Functional  Left            [] Functional  Right                                 [] Fisting             [] Fisting     [x] elevated Left [] depressed  Left [] elevated Right [x] depressed  Right      [] protracted Left [] retracted Left [] protracted Right [] retracted Right [] subluxed  Left              [] subluxed  Right         Goals for Wheelchair Mobility  [x]  Independence with mobility in the home with motor related ADLs (MRADLs)  []  Independence with MRADLs in the community []  Provide dependent mobility  []  Provide recline     [x] Provide tilt   Goals for Seating system [x]  Optimize pressure distribution [x]  Provide support needed to facilitate function or safety []  Provide corrective forces to assist with maintaining or improving posture []  Accommodate client's posture: current seated postures and positions are not flexible or will not tolerate corrective forces [x]  Client to be independent with relieving pressure in the wheelchair [] Enhance physiological function such as breathing, swallowing, digestion  Simulation ideas/Equipment trials:                                                                                                State why other equipment was unsuccessful:  MOBILITY BASE RECOMMENDATIONS and JUSTIFICATION: MOBILITY COMPONENT JUSTIFICATION  Manufacturer: [employer]          Model:   Stretto            Size: Width  16         Seat Depth   22         [x] provide transport from point A to B [x] promote Indep mobility  [x] is not a safe, functional ambulator [x] walker or cane inadequate [] non-standard width/depth necessary to accommodate anatomical measurement []                             [] Manual Mobility Base [] non-functional ambulator    [] Scooter/POV  [] can safely operate  [] can safely transfer   [] has adequate trunk stability  [] cannot functionally propel manual w/c  [x] Power Mobility Base  [] non-ambulatory  [x] cannot functionally propel manual wheelchair  [x]  cannot functionally and safely operate scooter/POV [x] can safely operate and willing to  [] Stroller Base [] infant/child  [] unable to propel manual wheelchair [] allows for growth [] non-functional ambulator [] non-functional UE [] Indep mobility is not a goal at this time  [x] Tilt  [] Forward                   [x] Backward                  [x] Powered tilt              [] Manual tilt  [x] change position against gravitational force on head and shoulders  [x] change position for pressure relief/cannot weight shift [] transfers  [] management of tone [x] rest periods [] control edema [x] facilitate postural control  []                                       [] Recline  [] Power recline on power base [] Manual recline on manual base  [] accommodate femur to back angle  [] bring to full recline for ADL care  [] change position for pressure relief/cannot weight shift [] rest periods [] repositioning for transfers or clothing/diaper /catheter changes [] head positioning  [] Lighter weight required [] self- propulsion  [] lifting []                                                 [] Heavy Duty required [] user weight greater than 250# [] extreme tone/ over active movement [] broken frame on previous chair []                                     [x]  Back  []  Angle Adjustable []  Custom molded        Tru Comfort                     [x] postural control [] control of tone/spasticity [] accommodation of range of motion [] UE functional control [] accommodation for seating system []                                          [] provide lateral trunk support [] accommodate deformity [x] provide posterior trunk support [x] provide lumbar/sacral support [x] support trunk in midline [x] Pressure relief over spinal processes  [x]  Seat Cushion  Tru Comfort                  [] impaired sensation  [] decubitus ulcers present [] history of pressure ulceration [] prevent pelvic extension [x] low maintenance  [x] stabilize pelvis  [] accommodate obliquity [] accommodate multiple deformity [x] neutralize lower extremity position [x] increase pressure distribution [x]   At risk with prolonged sitting                                       []  Pelvic/thigh support  []  Lateral thigh guide []  Distal medial pad  []  Distal lateral pad []  pelvis in neutral [] accommodate pelvis []  position upper legs []  alignment []  accommodate ROM []  decrease adduction [] accommodate tone [] removable for transfers [] decrease abduction  []  Lateral trunk Supports []  Lt     []  Rt [] decrease lateral trunk leaning [] control tone [] contour for increased contact [] safety  [] accommodate asymmetry []                                                 [x]  Mounting hardware  [] lateral trunk supports  [x] back   [x] seat [x] headrest      []  thigh support [] fixed   [] swing away [x] attach seat platform/cushion to w/c frame [x] attach back cushion to w/c frame [] mount postural supports [x] mount headrest  [] swing medial thigh support away [] swing lateral supports away for transfers  []                                                     Armrests  [] fixed [x] adjustable height [] removable   [] swing away  [x] flip back   [] reclining [x] full length pads [] desk    [] pads tubular  [x] provide support with elbow at 90   [] provide support for w/c tray [x] change of  height/angles for variable activities [] remove for transfers [x] allow to come closer to table top [] remove for access to tables []                                               Hangers/ Leg rests  [] 60 [] 70 [] 90 [] elevating [] heavy duty  [] articulating [] fixed [] lift off [] swing away     [] power [] provide LE support  [] accommodate to hamstring tightness [] elevate legs during recline   [] provide change in position for Legs [] Maintain placement of feet on footplate [] durability [] enable transfers [] decrease edema [] Accommodate lower leg length []                                         Foot support Footplate    [] Lt  []  Rt  [x]  Center mount [x] flip up                            [x] depth/angle adjustable [] Amputee adapter    []  Lt     []  Rt [x] provide foot support [x] accommodate to ankle ROM [x] transfers [] Provide support for residual extremity []  allow foot to go  under wheelchair base []  decrease tone  []                                                 []  Ankle strap/heel loops [] support foot on foot support [] decrease extraneous movement [] provide input to heel  [] protect foot  Tires: [] pneumatic  [] flat free inserts  [] solid  [] decrease maintenance  [] prevent frequent flats [] increase shock absorbency [] decrease pain from road shock [] decrease spasms from road shock []                                              [x]  Headrest  [] provide posterior head support [x] provide posterior neck support [] provide lateral head support [] provide anterior head support [x] support during tilt and recline [] improve feeding   [] improve respiration [] placement of switches [x] safety  [] accommodate ROM  [] accommodate tone [] improve visual orientation  []  Anterior chest strap []  Vest []  Shoulder retractors  [] decrease forward movement of shoulder [] accommodation of TLSO [] decrease forward movement of trunk [] decrease shoulder elevation [] added abdominal support [] alignment [] assistance  with shoulder control  []                                               Pelvic Positioner [x] Belt [] SubASIS bar [] Dual Pull [] stabilize tone [x] decrease falling out of chair/ **will not Decrease potential for sliding due to pelvic tilting [] prevent excessive rotation [] pad for protection over boney prominence [] prominence comfort [] special pull angle to control rotation []                                                  Upper ExtremitySupport  [] L   []  R [] Arm trough   [] hand support []  tray       [] full tray [] swivel mount [] decrease edema      [] decrease subluxation   [] control tone   [] placement for AAC/Computer/EADL [] decrease gravitational pull on shoulders [] provide midline positioning [] provide support to increase UE function [] provide hand support in natural position [] provide work surface   POWER WHEELCHAIR CONTROLS  [x] Proportional  [] Non-Proportional Type:   Joystick                                   [] Left  [x] Right [x] provides access for controlling wheelchair   [] lacks motor control to operate proportional drive control [] unable to understand proportional controls  Actuator Control Module  [x] Single  [] Multiple   [x] Allow the client to operate the power seat function(s) through the joystick control   [] Safety Reset Switches [] Used to change modes and stop the wheelchair when driving in latch mode    [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   [x] programming for accurate control [x] progressive Disease/changing condition [] non-proportional drive control needed [x] Needed in order to operate power seat functions through joystick control   [] Display box [] Allows user to see in which mode and drive the wheelchair is set  [] necessary for alternate controls    [] Digital interface electronics [] Allows  w/c to operate when using alternative drive controls  [] ASL Head Array [] Allows client to operate wheelchair  through switches placed in tri-panel headrest  [] Sip and puff with tubing  kit [] needed to operate sip and puff drive controls  [] Upgraded[employer], Comer [name], NP tracking electronics [] increase safety when driving [] correct tracking when on uneven surfaces  [x] Mount for switches or joystick [] Attaches switches to w/c  [x] Swing away for access or transfers [] midline for optimal placement [] provides for consistent access  [] Attendant controlled joystick plus mount [] safety [] long distance driving [] operation of seat functions [] compliance with transportation regulations []                                             Rear wheel placement/Axle adjustability [] None [] semi adjustable [] fully adjustable  [] improved UE access to wheels [] improved stability [] changing angle in space for improvement of postural stability [] 1-arm drive access [] amputee pad placement []                                Wheel rims/ hand rims  [] metal   [] plastic coated [] oblique projections           [] vertical projections [] Provide ability to propel manual wheelchair  []  Increase self-propulsion with hand weakness/decreased grasp  Push handles [] extended   [] angle adjustable              [] standard [] caregiver access [] caregiver assist [] allows "hooking" to enable increased ability to perform ADLs or maintain balance  One armed device   [] Lt   [] Rt [] enable propulsion of manual wheelchair with one arm   []                                            Brake/wheel lock extension []  Lt   []  Rt [] increase indep in applying wheel locks   [] Side guards [] prevent clothing getting caught in wheel or becoming soiled []  prevent skin tears/abrasions  Battery:   Group 24 x 2                                         [x] to power wheelchair                                                         Other:  Seat Elevator                                  Pt requires seat elevator to increase safety with reaching and accesibility to items located on high shelves/countertops to increase independence & safety with  ADL's and self care. Seat elevator also needed to increase ease with sit to stand transfers & transfers to unlevel surfaces.  The above equipment has a life- long use expectancy. Growth and changes in medical and/or functional conditions would be the exceptions. This is to certify that the therapist has no financial relationship with durable medical provider or manufacturer. The therapist will not receive remuneration of any kind for the equipment recommended in this evaluation.   Patient has mobility limitation that significantly impairs safe, timely participation in one or more mobility related ADL's. (bathing, toileting, feeding, dressing, grooming, moving from room to room)  [x]  Yes []  No  Will mobility device sufficiently improve ability to participate and/or be aided in participation of MRADL's?      [x]  Yes []  No  Can limitation be compensated for with use of a cane or walker?                                    []  Yes [x]  No  Does patient or caregiver demonstrate ability/potential ability & willingness to safely use the mobility device?    [x]  Yes []  No  Does patient's home environment support use of recommended mobility device?            [x]  Yes []  No  Does patient have sufficient upper extremity function necessary to functionally propel a manual wheelchair?     []  Yes [x]  No  Does patient have sufficient strength and trunk stability to safely operate a POV (scooter)?                                  []  Yes [x]  No  Does patient need additional features/benefits provided by a power wheelchair for MRADL's in the home?        [x]  Yes []  No  Does the patient demonstrate the ability to safely use a power wheelchair?                   [x]  Yes []  No     Physician's Name Printed: Baird Comer [name], NP                                                        Physician's Signature:  Date:     This is to  certify that I, the above signed therapist have the following affiliations: []  This DME provider []  Manufacturer of recommended equipment []  Patient's long term care facility [x]  None of the above  Therapist Name/Signature:  Elvie Kussmaul, PT                                         Date:  11-19-23  ASSESSMENT:  CLINICAL IMPRESSION: Patient is a 38 y.o. 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  Recommend Group 3 power wheelchair with power tilt to enable independence and safety with performing pressure relief and for rest periods in the wheelchair, eliminating need to transfer out of wheelchair for rest periods. Seat elevator also recommended to enable ease and safety with sit to stand transfers and to increase safety with reaching items on higher shelves,  countertops, refridgerator for independence with ADL's and self care activities.      CLINICAL DECISION MAKING: Evolving/moderate complexity  EVALUATION COMPLEXITY: Moderate  PLAN:  PT FREQUENCY: one time visit  PT DURATION: 1 week  PLANNED INTERVENTIONS:  initial eval and assistive technology assessment with report  PLAN FOR NEXT SESSION: N/A - eval only   Ervey Fallin, [place], PT 11/20/2023, 7:43 PM

## 2023-11-20 ENCOUNTER — Encounter: Payer: Self-pay | Admitting: Physical Therapy

## 2023-12-06 ENCOUNTER — Other Ambulatory Visit (HOSPITAL_COMMUNITY): Payer: Self-pay
